# Patient Record
Sex: Male | Born: 1965 | Race: White | Hispanic: No | Marital: Married | Smoking: Never smoker
Health system: Southern US, Community
[De-identification: ages and names within clinical notes are randomized; demographics above are authoritative.]

## PROBLEM LIST (undated history)

## (undated) DIAGNOSIS — F419 Anxiety disorder, unspecified: Secondary | ICD-10-CM

## (undated) DIAGNOSIS — R972 Elevated prostate specific antigen [PSA]: Secondary | ICD-10-CM

## (undated) DIAGNOSIS — K602 Anal fissure, unspecified: Secondary | ICD-10-CM

## (undated) HISTORY — DX: Anxiety disorder, unspecified: F41.9

## (undated) HISTORY — DX: Elevated prostate specific antigen (PSA): R97.20

## (undated) HISTORY — DX: Anal fissure, unspecified: K60.2

## (undated) HISTORY — PX: WISDOM TOOTH EXTRACTION: SHX21

---

## 2002-11-14 ENCOUNTER — Ambulatory Visit (HOSPITAL_COMMUNITY): Admission: RE | Admit: 2002-11-14 | Discharge: 2002-11-14 | Payer: Self-pay | Admitting: Family Medicine

## 2008-07-10 ENCOUNTER — Encounter (INDEPENDENT_AMBULATORY_CARE_PROVIDER_SITE_OTHER): Payer: Self-pay | Admitting: *Deleted

## 2008-07-21 ENCOUNTER — Ambulatory Visit: Payer: Self-pay | Admitting: Family Medicine

## 2008-07-23 ENCOUNTER — Encounter (INDEPENDENT_AMBULATORY_CARE_PROVIDER_SITE_OTHER): Payer: Self-pay | Admitting: *Deleted

## 2009-03-17 ENCOUNTER — Ambulatory Visit: Payer: Self-pay | Admitting: Family Medicine

## 2009-03-18 ENCOUNTER — Telehealth (INDEPENDENT_AMBULATORY_CARE_PROVIDER_SITE_OTHER): Payer: Self-pay | Admitting: *Deleted

## 2009-03-18 LAB — CONVERTED CEMR LAB
Cholesterol: 151 mg/dL (ref 0–200)
HDL: 27.4 mg/dL — ABNORMAL LOW (ref >39.00)
Triglycerides: 100 mg/dL (ref 0.0–149.0)
VLDL: 20 mg/dL (ref 0.0–40.0)

## 2009-11-02 ENCOUNTER — Ambulatory Visit: Payer: Self-pay | Admitting: Family Medicine

## 2009-11-02 DIAGNOSIS — M25561 Pain in right knee: Secondary | ICD-10-CM | POA: Insufficient documentation

## 2009-11-02 DIAGNOSIS — M25569 Pain in unspecified knee: Secondary | ICD-10-CM

## 2009-11-04 ENCOUNTER — Telehealth (INDEPENDENT_AMBULATORY_CARE_PROVIDER_SITE_OTHER): Payer: Self-pay | Admitting: *Deleted

## 2009-11-04 LAB — CONVERTED CEMR LAB
ALT: 19 units/L (ref 0–53)
AST: 23 units/L (ref 0–37)
BUN: 14 mg/dL (ref 6–23)
Basophils Relative: 0.7 % (ref 0.0–3.0)
Chloride: 107 meq/L (ref 96–112)
Eosinophils Relative: 1.5 % (ref 0.0–5.0)
GFR calc non Af Amer: 88.44 mL/min (ref >60)
HCT: 36.2 % — ABNORMAL LOW (ref 39.0–52.0)
Hemoglobin: 11.8 g/dL — ABNORMAL LOW (ref 13.0–17.0)
LDL Cholesterol: 123 mg/dL — ABNORMAL HIGH (ref 0–99)
Lymphs Abs: 1.2 10*3/uL (ref 0.7–4.0)
Monocytes Relative: 9.3 % (ref 3.0–12.0)
Platelets: 302 10*3/uL (ref 150.0–400.0)
Potassium: 4.3 meq/L (ref 3.5–5.1)
RBC: 5.02 M/uL (ref 4.22–5.81)
Sodium: 140 meq/L (ref 135–145)
TSH: 0.93 microintl units/mL (ref 0.35–5.50)
Total Bilirubin: 0.8 mg/dL (ref 0.3–1.2)
Total CHOL/HDL Ratio: 5
Total Protein: 7.3 g/dL (ref 6.0–8.3)
VLDL: 21.8 mg/dL (ref 0.0–40.0)
WBC: 4.5 10*3/uL (ref 4.5–10.5)

## 2009-11-06 ENCOUNTER — Encounter (INDEPENDENT_AMBULATORY_CARE_PROVIDER_SITE_OTHER): Payer: Self-pay | Admitting: *Deleted

## 2009-11-16 ENCOUNTER — Ambulatory Visit: Payer: Self-pay | Admitting: Family Medicine

## 2010-04-21 ENCOUNTER — Telehealth (INDEPENDENT_AMBULATORY_CARE_PROVIDER_SITE_OTHER): Payer: Self-pay | Admitting: *Deleted

## 2010-07-11 LAB — CONVERTED CEMR LAB
Basophils Absolute: 0 10*3/uL (ref 0.0–0.1)
Basophils Relative: 0.1 % (ref 0.0–3.0)
Bilirubin, Direct: 0.1 mg/dL (ref 0.0–0.3)
Calcium: 9 mg/dL (ref 8.4–10.5)
Cholesterol: 177 mg/dL (ref 0–200)
Creatinine, Ser: 0.9 mg/dL (ref 0.4–1.5)
Eosinophils Absolute: 0.1 10*3/uL (ref 0.0–0.7)
GFR calc non Af Amer: 97 mL/min
LDL Cholesterol: 111 mg/dL — ABNORMAL HIGH (ref 0–99)
Lymphocytes Relative: 22.3 % (ref 12.0–46.0)
MCHC: 32.4 g/dL (ref 30.0–36.0)
MCV: 78.5 fL (ref 78.0–100.0)
Neutro Abs: 3.2 10*3/uL (ref 1.4–7.7)
Neutrophils Relative %: 67.7 % (ref 43.0–77.0)
PSA: 1.42 ng/mL (ref 0.10–4.00)
RBC: 5.48 M/uL (ref 4.22–5.81)
RDW: 14.7 % — ABNORMAL HIGH (ref 11.5–14.6)
Sodium: 137 meq/L (ref 135–145)
TSH: 0.9 microintl units/mL (ref 0.35–5.50)
Total Bilirubin: 1.2 mg/dL (ref 0.3–1.2)
Triglycerides: 181 mg/dL — ABNORMAL HIGH (ref 0–149)
VLDL: 36 mg/dL (ref 0–40)

## 2010-07-13 NOTE — Letter (Signed)
Summary: [facility]: Immunoassay Fecal Occult Blood (iFOB) Order Form  [facility] [place]  [address] [place], [place] [address]   Phone: [phone]  Fax: [phone]      [facility]: Immunoassay Fecal Occult Blood (iFOB) Order Form   Nov 06, 2009 MRN: [id number]   COLYN MIRON 04/14/1966   Physicican Name:_________Tabori______________  Diagnosis Code:___________285.9_____________      Doristine Devoid

## 2010-07-13 NOTE — Assessment & Plan Note (Signed)
Summary: cpx/lab//cbs   Vital Signs:  Patient profile:   45 year old male Height:      66 inches Weight:      185 pounds BMI:     29.97 Pulse rate:   62 / minute BP sitting:   112 / 80  (left arm)  Vitals Entered By: Doristine Devoid (Nov 02, 2009 8:33 AM) CC: CPX AND LAB    History of Present Illness: 45 yo man here today for CPE.  has been taking Fish Oil.  R knee pain- 'i can't put weight on it'.  able to ride bike and walk w/out too much difficulty but pain w/ doing steps.  pain is anterior.  occurs after strenuous exercise.  no improvement w/ ice/ibuprofen.  Preventive Screening-Counseling & Management  Alcohol-Tobacco     Alcohol drinks/day: 0     Smoking Status: never  Caffeine-Diet-Exercise     Does Patient Exercise: yes     Type of exercise: bike, walk     Times/week: 4      Sexual History:  currently monogamous.        Drug Use:  never.    Current Medications (verified): 1)  Fish Oil 300 Mg Caps (Omega-3 Fatty Acids) .... As Directed  Allergies (verified): No Known Drug Allergies  Past History:  Family History: Last updated: 07/21/2008 BREAST CA--MOTHER BLADDER CA--FATHER HTN- no DM- Father Hyperlipidemia- no  Social History: Last updated: 07/21/2008 lives w/ wife and 2 kids (47, 47) no tobacco, ETOH, drugs Art gallery manager  Social History: Does Patient Exercise:  yes Sexual History:  currently monogamous Drug Use:  never  Review of Systems  The patient denies anorexia, fever, weight loss, weight gain, vision loss, decreased hearing, hoarseness, chest pain, syncope, dyspnea on exertion, peripheral edema, prolonged cough, headaches, abdominal pain, melena, hematochezia, severe indigestion/heartburn, hematuria, suspicious skin lesions, depression, abnormal bleeding, enlarged lymph nodes, and testicular masses.    Physical Exam  General:  Well-developed,well-nourished,in no acute distress; alert,appropriate and cooperative throughout examination Head:   Normocephalic and atraumatic without obvious abnormalities. No apparent alopecia or balding. Eyes:  No corneal or conjunctival inflammation noted. EOMI. Perrla. Funduscopic exam benign, without hemorrhages, exudates or papilledema. Vision grossly normal. Ears:  External ear exam shows no significant lesions or deformities.  Otoscopic examination reveals clear canals, tympanic membranes are intact bilaterally without bulging, retraction, inflammation or discharge. Hearing is grossly normal bilaterally. Nose:  External nasal examination shows no deformity or inflammation. Nasal mucosa are pink and moist without lesions or exudates. Mouth:  Oral mucosa and oropharynx without lesions or exudates.  Teeth in good repair. Neck:  No deformities, masses, or tenderness noted. Lungs:  Normal respiratory effort, chest expands symmetrically. Lungs are clear to auscultation, no crackles or wheezes. Heart:  Normal rate and regular rhythm. S1 and S2 normal without gallop, murmur, click, rub or other extra sounds. Abdomen:  Bowel sounds positive,abdomen soft and non-tender without masses, organomegaly or hernias noted. Rectal:  No external abnormalities noted. Normal sphincter tone. No rectal masses or tenderness. Genitalia:  Testes bilaterally descended without nodularity, tenderness or masses. No scrotal masses or lesions. No penis lesions or urethral discharge. Prostate:  Prostate gland firm and smooth, no enlargement, nodularity, tenderness, mass, asymmetry or induration. Msk:  R knee- no joint line tenderness, no pain w/ full flexion or extension.  no pain w/ internal/external rotation.  some TTP over patellar tendon Pulses:  +2 carotid, radial, DP Extremities:  No clubbing, cyanosis, edema, or deformity noted with normal full  range of motion of all joints.   Neurologic:  No cranial nerve deficits noted. Station and gait are normal. Plantar reflexes are down-going bilaterally. DTRs are symmetrical throughout.  Sensory, motor and coordinative functions appear intact. Skin:  Intact without suspicious lesions or rashes Cervical Nodes:  No lymphadenopathy noted Axillary Nodes:  No palpable lymphadenopathy Psych:  Cognition and judgment appear intact. Alert and cooperative with normal attention span and concentration. No apparent delusions, illusions, hallucinations   Impression & Recommendations:  Problem # 1:  HEALTHY ADULT MALE (ICD-V70.0) Assessment Unchanged pt's PE WNL.  check labs.  anticipatory guidance provided. Orders: Venipuncture (91478) TLB-Lipid Panel (80061-LIPID) TLB-BMP (Basic Metabolic Panel-BMET) (80048-METABOL) TLB-CBC Platelet - w/Differential (85025-CBCD) TLB-Hepatic/Liver Function Pnl (80076-HEPATIC) TLB-TSH (Thyroid Stimulating Hormone) (84443-TSH) TLB-PSA (Prostate Specific Antigen) (84153-PSA)  Problem # 2:  FAMILY HISTORY OF MALIGNANT NEOPLASM BLADDER (ICD-V16.52) Assessment: New check urine for blood given family hx Orders: TLB-Udip ONLY (81003-UDIP)  Problem # 3:  KNEE PAIN, RIGHT (ICD-719.46) Assessment: New likely a patellar tendonitis given pain w/ stepping up and only after exercise.  start scheduled NSAIDs and ice- if no improvement will refer to sports med.  Pt expresses understanding and is in agreement w/ this plan. His updated medication list for this problem includes:    Naproxen 500 Mg Tabs (Naproxen) .Marland Kitchen... 1 tab by mouth two times a day x7-10 days and then as needed  Complete Medication List: 1)  Fish Oil 300 Mg Caps (Omega-3 fatty acids) .... As directed 2)  Naproxen 500 Mg Tabs (Naproxen) .Marland Kitchen.. 1 tab by mouth two times a day x7-10 days and then as needed  Patient Instructions: 1)  Follow up in 1 year or as needed 2)  Take the Naproxen two times a day x7-10 days (take with food) and then as needed for pain 3)  Ice the knee after exercise 4)  If no improvement in your knee pain after 10-14 days, call and we can send you to sports med 5)  We'll  notify you of your lab results 6)  Call with any question or concerns 7)  Have a great summer!! Prescriptions: NAPROXEN 500 MG TABS (NAPROXEN) 1 tab by mouth two times a day x7-10 days and then as needed  #60 x 1   Entered and Authorized by:   Neena Rhymes MD   Signed by:   Neena Rhymes MD on 11/02/2009   Method used:   Electronically to        [facility]* (retail)       [address]       [place], [place]  [id number]       Ph: [phone]       Fax: [phone]   RxID:   [id number]   Appended Document: cpx/lab//cbs  Laboratory Results   Urine Tests   Date/Time Reported: Nov 02, 2009 11:25 AM   Routine Urinalysis   Color: yellow Appearance: Clear Glucose: negative   (Normal Range: Negative) Bilirubin: negative   (Normal Range: Negative) Ketone: negative   (Normal Range: Negative) Spec. Gravity: >=1.030   (Normal Range: 1.003-1.035) Blood: negative   (Normal Range: Negative) pH: 5.0   (Normal Range: 5.0-8.0) Protein: negative   (Normal Range: Negative) Urobilinogen: negative   (Normal Range: 0-1) Nitrite: negative   (Normal Range: Negative) Leukocyte Esterace: negative   (Normal Range: Negative)    Comments: Floydene Flock  Nov 02, 2009 11:25 AM

## 2010-07-13 NOTE — Progress Notes (Signed)
Summary: labs  Phone Note Outgoing Call   Call placed by: Doristine Devoid,  Nov 04, 2009 3:56 PM Call placed to: Patient Summary of Call: pt w/ iron deficiency anemia- needs to start FeSO4 325mg  daily (also start stool softener) and needs to complete stool cards to evaluate for possible cause of blood loss.  remainder of labs look good.  Follow-up for Phone Call        left message on machine ........Marland KitchenDoristine Devoid  Nov 04, 2009 3:56 PM   spoke w/ patient aware of labs and to start iron medication ...........Marland KitchenDoristine Devoid  Nov 05, 2009 9:03 AM     New/Updated Medications: FERROUS SULFATE 325 (65 FE) MG TABS (FERROUS SULFATE) take one tablet daily Prescriptions: FERROUS SULFATE 325 (65 FE) MG TABS (FERROUS SULFATE) take one tablet daily  #30 x 3   Entered by:   Doristine Devoid   Authorized by:   Neena Rhymes MD   Signed by:   Doristine Devoid on 11/05/2009   Method used:   Electronically to        [facility]* (retail)       [address]       [place], [place]  [id number]       Ph: [phone]       Fax: [phone]   RxID:   [id number]

## 2010-07-13 NOTE — Progress Notes (Signed)
Summary: ferrous refill   Phone Note Refill Request Call back at [phone] Message from:  Pharmacy on April 21, 2010 8:06 AM  Refills Requested: Medication #1:  FERROUS SULFATE 325 (65 FE) MG TABS take one tablet daily.   Dosage confirmed as above?Dosage Confirmed   Supply Requested: 1 month   Last Refilled: 10/16/2009 [facility]  Next Appointment Scheduled: none Initial call taken by: Harold Barban,  April 21, 2010 8:06 AM    Prescriptions: FERROUS SULFATE 325 (65 FE) MG TABS (FERROUS SULFATE) take one tablet daily  #30 x 6   Entered by:   Doristine Devoid CMA   Authorized by:   Neena Rhymes MD   Signed by:   Doristine Devoid CMA on 04/21/2010   Method used:   Electronically to        [facility]* (retail)       [address]       [place], [place]  [id number]       Ph: [phone]       Fax: [phone]   RxID:   [id number]

## 2010-11-18 ENCOUNTER — Encounter: Payer: Self-pay | Admitting: Family Medicine

## 2010-12-14 ENCOUNTER — Encounter: Payer: Self-pay | Admitting: Family Medicine

## 2010-12-16 ENCOUNTER — Encounter: Payer: Self-pay | Admitting: Family Medicine

## 2010-12-16 ENCOUNTER — Ambulatory Visit (INDEPENDENT_AMBULATORY_CARE_PROVIDER_SITE_OTHER): Payer: BC Managed Care – PPO | Admitting: Family Medicine

## 2010-12-16 DIAGNOSIS — Z0000 Encounter for general adult medical examination without abnormal findings: Secondary | ICD-10-CM

## 2010-12-16 LAB — CBC WITH DIFFERENTIAL/PLATELET
Basophils Absolute: 0 10*3/uL (ref 0.0–0.1)
Eosinophils Absolute: 0.1 10*3/uL (ref 0.0–0.7)
Eosinophils Relative: 2.4 % (ref 0.0–5.0)
HCT: 43.8 % (ref 39.0–52.0)
Lymphs Abs: 1 10*3/uL (ref 0.7–4.0)
MCHC: 33.9 g/dL (ref 30.0–36.0)
MCV: 86.7 fl (ref 78.0–100.0)
Monocytes Absolute: 0.4 10*3/uL (ref 0.1–1.0)
Neutrophils Relative %: 66.4 % (ref 43.0–77.0)
Platelets: 217 10*3/uL (ref 150.0–400.0)
RDW: 14.6 % (ref 11.5–14.6)
WBC: 4.5 10*3/uL (ref 4.5–10.5)

## 2010-12-16 LAB — POCT URINALYSIS DIPSTICK
Bilirubin, UA: NEGATIVE
Blood, UA: NEGATIVE
Ketones, UA: NEGATIVE
Leukocytes, UA: NEGATIVE
Nitrite, UA: NEGATIVE
pH, UA: 5

## 2010-12-16 LAB — HEPATIC FUNCTION PANEL
AST: 19 U/L (ref 0–37)
Albumin: 4.2 g/dL (ref 3.5–5.2)
Alkaline Phosphatase: 107 U/L (ref 39–117)
Bilirubin, Direct: 0 mg/dL (ref 0.0–0.3)
Total Bilirubin: 0.5 mg/dL (ref 0.3–1.2)

## 2010-12-16 LAB — LIPID PANEL
HDL: 37.6 mg/dL — ABNORMAL LOW (ref >39.00)
Triglycerides: 230 mg/dL — ABNORMAL HIGH (ref 0.0–149.0)
VLDL: 46 mg/dL — ABNORMAL HIGH (ref 0.0–40.0)

## 2010-12-16 LAB — BASIC METABOLIC PANEL
Calcium: 8.7 mg/dL (ref 8.4–10.5)
GFR: 93.55 mL/min (ref >60.00)
Potassium: 4.1 mEq/L (ref 3.5–5.1)
Sodium: 136 mEq/L (ref 135–145)

## 2010-12-16 LAB — PSA: PSA: 0.75 ng/mL (ref 0.10–4.00)

## 2010-12-16 NOTE — Assessment & Plan Note (Signed)
Pt's PE WNL.  Check labs.  Anticipatory guidance provided.  

## 2010-12-16 NOTE — Progress Notes (Signed)
  Subjective:    Patient ID: Alexander Schroeder, male    DOB: 10/12/1965, 45 y.o.   MRN: [id number]  HPI CPE- no concerns about health.   Review of Systems Patient reports no vision/hearing changes, anorexia, fever ,adenopathy, persistant/recurrent hoarseness, swallowing issues, chest pain, palpitations, edema, persistant/recurrent cough, hemoptysis, dyspnea (rest,exertional, paroxysmal nocturnal), gastrointestinal  bleeding (melena, rectal bleeding), abdominal pain, excessive heart burn, GU symptoms (dysuria, hematuria, voiding/incontinence issues) syncope, focal weakness, memory loss, numbness & tingling, skin/hair/nail changes, depression, anxiety, abnormal bruising/bleeding, musculoskeletal symptoms/signs.     Objective:   Physical Exam BP 120/80  Temp(Src) 98.9 F (37.2 C) (Oral)  Ht 5\' 6"  (1.676 m)  Wt 185 lb 3.2 oz (84.006 kg)  BMI 29.89 kg/m2  General Appearance:    Alert, cooperative, no distress, appears stated age  Head:    Normocephalic, without obvious abnormality, atraumatic  Eyes:    PERRL, conjunctiva/corneas clear, EOM's intact, fundi    benign, both eyes       Ears:    Normal TM's and external ear canals, both ears  Nose:   Nares normal, septum midline, mucosa normal, no drainage   or sinus tenderness  Throat:   Lips, mucosa, and tongue normal; teeth and gums normal  Neck:   Supple, symmetrical, trachea midline, no adenopathy;       thyroid:  No enlargement/tenderness/nodules  Back:     Symmetric, no curvature, ROM normal, no CVA tenderness  Lungs:     Clear to auscultation bilaterally, respirations unlabored  Chest wall:    No tenderness or deformity  Heart:    Regular rate and rhythm, S1 and S2 normal, no murmur, rub   or gallop  Abdomen:     Soft, non-tender, bowel sounds active all four quadrants,    no masses, no organomegaly  Genitalia:    Normal male without lesion, masses, discharge or tenderness  Rectal:    Normal tone, normal prostate, no masses or  tenderness;   guaiac negative stool  Extremities:   Extremities normal, atraumatic, no cyanosis or edema  Pulses:   2+ and symmetric all extremities  Skin:   Skin color, texture, turgor normal, no rashes or lesions  Lymph nodes:   Cervical, supraclavicular, and axillary nodes normal  Neurologic:   CNII-XII intact. Normal strength, sensation and reflexes      throughout          Assessment & Plan:

## 2010-12-16 NOTE — Patient Instructions (Signed)
We'll notify you of your lab results Your exam looks great!  Keep up the good work! We'll see you in 1 year- sooner if needed Have a great trip!

## 2010-12-17 ENCOUNTER — Encounter: Payer: Self-pay | Admitting: *Deleted

## 2010-12-26 ENCOUNTER — Other Ambulatory Visit: Payer: Self-pay | Admitting: Family Medicine

## 2010-12-27 NOTE — Telephone Encounter (Signed)
Refill sent.

## 2011-05-26 ENCOUNTER — Other Ambulatory Visit: Payer: Self-pay | Admitting: Family Medicine

## 2011-05-26 MED ORDER — FERROUS SULFATE 325 (65 FE) MG PO TABS: 325.0000 mg | ORAL_TABLET | Freq: Every day | ORAL | Status: DC

## 2011-05-26 NOTE — Telephone Encounter (Signed)
rx sent to pharmacy by e-script  

## 2011-10-10 ENCOUNTER — Telehealth: Payer: Self-pay | Admitting: Family Medicine

## 2011-10-10 NOTE — Telephone Encounter (Signed)
Refill: Ferrous sulfate 325 mg #30. Take 1 tablet once daily. Last fill 09-08-11

## 2011-10-13 MED ORDER — FERROUS SULFATE 325 (65 FE) MG PO TABS: 325.0000 mg | ORAL_TABLET | Freq: Every day | ORAL | Status: DC

## 2011-10-13 NOTE — Telephone Encounter (Signed)
rx sent to pharmacy by e-script  

## 2012-01-11 ENCOUNTER — Encounter: Payer: Self-pay | Admitting: Family Medicine

## 2012-01-11 ENCOUNTER — Ambulatory Visit (INDEPENDENT_AMBULATORY_CARE_PROVIDER_SITE_OTHER): Payer: BC Managed Care – PPO | Admitting: Family Medicine

## 2012-01-11 VITALS — BP 125/72 | HR 68 | Temp 98.0°F | Ht 66.0 in | Wt 185.0 lb

## 2012-01-11 DIAGNOSIS — Z0000 Encounter for general adult medical examination without abnormal findings: Secondary | ICD-10-CM

## 2012-01-11 LAB — CBC WITH DIFFERENTIAL/PLATELET
Basophils Relative: 0.7 % (ref 0.0–3.0)
Eosinophils Relative: 1.7 % (ref 0.0–5.0)
Hemoglobin: 15.2 g/dL (ref 13.0–17.0)
Lymphocytes Relative: 24.8 % (ref 12.0–46.0)
MCHC: 33.4 g/dL (ref 30.0–36.0)
Neutro Abs: 2.8 10*3/uL (ref 1.4–7.7)
Neutrophils Relative %: 64.4 % (ref 43.0–77.0)
RBC: 5.23 Mil/uL (ref 4.22–5.81)
WBC: 4.3 10*3/uL — ABNORMAL LOW (ref 4.5–10.5)

## 2012-01-11 LAB — HEPATIC FUNCTION PANEL
ALT: 20 U/L (ref 0–53)
AST: 18 U/L (ref 0–37)
Albumin: 3.9 g/dL (ref 3.5–5.2)
Total Protein: 6.9 g/dL (ref 6.0–8.3)

## 2012-01-11 LAB — PSA: PSA: 2.08 ng/mL (ref 0.10–4.00)

## 2012-01-11 LAB — BASIC METABOLIC PANEL
BUN: 11 mg/dL (ref 6–23)
CO2: 28 mEq/L (ref 19–32)
Chloride: 104 mEq/L (ref 96–112)
Creatinine, Ser: 1 mg/dL (ref 0.4–1.5)
Glucose, Bld: 104 mg/dL — ABNORMAL HIGH (ref 70–99)
Potassium: 4.3 mEq/L (ref 3.5–5.1)

## 2012-01-11 LAB — LIPID PANEL
Cholesterol: 194 mg/dL (ref 0–200)
VLDL: 18.2 mg/dL (ref 0.0–40.0)

## 2012-01-11 LAB — TSH: TSH: 0.93 u[IU]/mL (ref 0.35–5.50)

## 2012-01-11 MED ORDER — TETANUS-DIPHTH-ACELL PERTUSSIS 5-2.5-18.5 LF-MCG/0.5 IM SUSP
0.5000 mL | Freq: Once | INTRAMUSCULAR | Status: AC
  Administered 2012-01-11: 0.5 mL via INTRAMUSCULAR

## 2012-01-11 NOTE — Patient Instructions (Addendum)
Follow up in 1 year or as needed We'll notify you of your lab results Keep up the good work!  You look great! Call with any questions or concerns Enjoy the rest of your summer!!!

## 2012-01-11 NOTE — Assessment & Plan Note (Signed)
Normal PE.  Check labs.  Anticipatory guidance provided.  

## 2012-01-11 NOTE — Progress Notes (Signed)
  Subjective:    Patient ID: Alexander Schroeder, male    DOB: [birth year], 46 y.o.   MRN: [id number]  HPI CPE- no concerns.   Review of Systems Patient reports no vision/hearing changes, anorexia, fever ,adenopathy, persistant/recurrent hoarseness, swallowing issues, chest pain, palpitations, edema, persistant/recurrent cough, hemoptysis, dyspnea (rest,exertional, paroxysmal nocturnal), gastrointestinal  bleeding (melena, rectal bleeding), abdominal pain, excessive heart burn, GU symptoms (dysuria, hematuria, voiding/incontinence issues) syncope, focal weakness, memory loss, numbness & tingling, skin/hair/nail changes, depression, anxiety, abnormal bruising/bleeding, musculoskeletal symptoms/signs.     Objective:   Physical Exam BP 125/72  Pulse 68  Temp 98 F (36.7 C) (Oral)  Ht 5\' 6"  (1.676 m)  Wt 185 lb (83.915 kg)  BMI 29.86 kg/m2  SpO2 98%  General Appearance:    Alert, cooperative, no distress, appears stated age  Head:    Normocephalic, without obvious abnormality, atraumatic  Eyes:    PERRL, conjunctiva/corneas clear, EOM's intact, fundi    benign, both eyes       Ears:    Normal TM's and external ear canals, both ears  Nose:   Nares normal, septum midline, mucosa normal, no drainage   or sinus tenderness  Throat:   Lips, mucosa, and tongue normal; teeth and gums normal  Neck:   Supple, symmetrical, trachea midline, no adenopathy;       thyroid:  No enlargement/tenderness/nodules  Back:     Symmetric, no curvature, ROM normal, no CVA tenderness  Lungs:     Clear to auscultation bilaterally, respirations unlabored  Chest wall:    No tenderness or deformity  Heart:    Regular rate and rhythm, S1 and S2 normal, no murmur, rub   or gallop  Abdomen:     Soft, non-tender, bowel sounds active all four quadrants,    no masses, no organomegaly  Genitalia:    Normal male without lesion, discharge or tenderness  Rectal:    Normal tone, normal prostate, no masses or tenderness    Extremities:   Extremities normal, atraumatic, no cyanosis or edema  Pulses:   2+ and symmetric all extremities  Skin:   Skin color, texture, turgor normal, no rashes or lesions  Lymph nodes:   Cervical, supraclavicular, and axillary nodes normal  Neurologic:   CNII-XII intact. Normal strength, sensation and reflexes      throughout          Assessment & Plan:

## 2012-01-17 ENCOUNTER — Other Ambulatory Visit: Payer: Self-pay | Admitting: *Deleted

## 2012-01-17 DIAGNOSIS — R972 Elevated prostate specific antigen [PSA]: Secondary | ICD-10-CM

## 2012-02-14 ENCOUNTER — Other Ambulatory Visit: Payer: Self-pay | Admitting: Family Medicine

## 2012-02-14 MED ORDER — FERROUS SULFATE 325 (65 FE) MG PO TABS: 325.0000 mg | ORAL_TABLET | Freq: Every day | ORAL | Status: DC

## 2012-02-14 NOTE — Telephone Encounter (Signed)
rx sent to pharmacy by e-script  

## 2012-02-14 NOTE — Telephone Encounter (Signed)
Refill ferrous sulfate 325 MG Take 1 tablet once daily # 30, last fill 7.31.13 Last ov 7.31.13 ferrous sulfate 325 (65 FE) MG Take 1 tablet (325 mg total) by mouth daily with breakfast. 70

## 2012-05-25 ENCOUNTER — Encounter: Payer: Self-pay | Admitting: Family Medicine

## 2012-05-25 ENCOUNTER — Ambulatory Visit (INDEPENDENT_AMBULATORY_CARE_PROVIDER_SITE_OTHER): Payer: BC Managed Care – PPO | Admitting: Family Medicine

## 2012-05-25 VITALS — BP 130/90 | HR 65 | Temp 98.2°F | Ht 66.0 in | Wt 181.6 lb

## 2012-05-25 DIAGNOSIS — G4700 Insomnia, unspecified: Secondary | ICD-10-CM

## 2012-05-25 DIAGNOSIS — F411 Generalized anxiety disorder: Secondary | ICD-10-CM

## 2012-05-25 DIAGNOSIS — F419 Anxiety disorder, unspecified: Secondary | ICD-10-CM

## 2012-05-25 MED ORDER — BUPROPION HCL ER (XL) 150 MG PO TB24: 150.0000 mg | ORAL_TABLET | Freq: Every day | ORAL | Status: DC

## 2012-05-25 MED ORDER — TRAZODONE HCL 50 MG PO TABS: 25.0000 mg | ORAL_TABLET | Freq: Every evening | ORAL | Status: DC | PRN

## 2012-05-25 NOTE — Progress Notes (Signed)
  Subjective:    Patient ID: Alexander Schroeder, male    DOB: [birth year], 46 y.o.   MRN: [id number]  HPI Insomnia- 'i can't seem to relax'.  'can't lay flat in the bed- my head's pounding, it feels like i can't breathe'.  Is now stressing about 'it getting dark, going to bed, getting up'.  Work is going well.  Kids are well.  sxs started 4 weeks ago when he had cold sxs.  Was taking Mucinex D.  Started worrying that if he fell asleep he wouldn't breathe.  Then had travel interfere w/ his sleep.  In the last 2 weeks has started waking at the same time every night and not being able to fall back to sleep.  Sleeping better in recliner than bed.  Denies GERD, no coughing, no loud snoring, no breathing pauses.  Will wake w/ thoughts racing.  Has not tried OTC sleep aides.  Pt and wife admit to underlying anxiety.  Both are tearful during visit.   Review of Systems For ROS see HPI     Objective:   Physical Exam  Vitals reviewed. Constitutional: He is oriented to person, place, and time. He appears well-developed and well-nourished.       tearful  HENT:  Head: Normocephalic and atraumatic.  Neurological: He is alert and oriented to person, place, and time.  Skin: Skin is warm and dry.  Psychiatric: His behavior is normal. Thought content normal.       Tearful, anxious          Assessment & Plan:

## 2012-05-25 NOTE — Patient Instructions (Addendum)
Follow up in 3-4 weeks to recheck sleep/mood Start the Wellbutrin daily Take the Trazodone- start w/ 1/2 tab- for sleep Try and find a stress outlet Call with any questions or concerns Happy Holidays!!!

## 2012-05-27 NOTE — Assessment & Plan Note (Signed)
New.  Likely a manifestation of pt's anxiety.  Start daily anxiety med- wellbutrin- and tx insomnia w/ trazodone.  Reviewed supportive care and red flags that should prompt return.  Pt expressed understanding and is in agreement w/ plan.

## 2012-05-27 NOTE — Assessment & Plan Note (Signed)
New.  Both pt and wife are tearful during OV.  Pt admits to underlying anxiety for 'years' but recently worsening.  Will start daily controller med and assist sleep w/ trazodone.  Discussed counseling.  Names and #s provided.  Will follow closely.  Pt and wife in agreement w/ plan.

## 2012-06-01 ENCOUNTER — Ambulatory Visit (INDEPENDENT_AMBULATORY_CARE_PROVIDER_SITE_OTHER): Payer: BC Managed Care – PPO | Admitting: Family Medicine

## 2012-06-01 ENCOUNTER — Telehealth: Payer: Self-pay | Admitting: Family Medicine

## 2012-06-01 ENCOUNTER — Encounter: Payer: Self-pay | Admitting: Family Medicine

## 2012-06-01 VITALS — BP 112/76 | HR 76 | Temp 98.3°F | Wt 183.0 lb

## 2012-06-01 DIAGNOSIS — F411 Generalized anxiety disorder: Secondary | ICD-10-CM

## 2012-06-01 DIAGNOSIS — F410 Panic disorder [episodic paroxysmal anxiety] without agoraphobia: Secondary | ICD-10-CM

## 2012-06-01 DIAGNOSIS — F419 Anxiety disorder, unspecified: Secondary | ICD-10-CM

## 2012-06-01 MED ORDER — CITALOPRAM HYDROBROMIDE 20 MG PO TABS: 20.0000 mg | ORAL_TABLET | Freq: Every day | ORAL | Status: DC

## 2012-06-01 MED ORDER — ALPRAZOLAM 0.25 MG PO TABS: ORAL_TABLET | ORAL | Status: DC

## 2012-06-01 NOTE — Progress Notes (Signed)
  Subjective:    Patient ID: Alexander Schroeder, male    DOB: May 24, 1966, 46 y.o.   MRN: [id number]  HPI Pt here to f/u anxiety.  He feels like the wellbutrin is making him obsess over his breathing and he is having inc anxiety.   Review of Systems As above    Objective:   Physical Exam BP 112/76  Pulse 76  Temp 98.3 F (36.8 C) (Oral)  Wt 183 lb (83.008 kg)  SpO2 98% General appearance: alert, cooperative, appears stated age and no distress Psych--  Anxiety, fidgety       Assessment & Plan:

## 2012-06-01 NOTE — Telephone Encounter (Signed)
Appointment Scheduled:  06/01/2012 15:00:00  Appointment Scheduled Provider:  Lelon Perla

## 2012-06-01 NOTE — Patient Instructions (Addendum)
Anxiety and Panic Attacks Your caregiver has informed you that you are having an anxiety or panic attack. There may be many forms of this. Most of the time these attacks come suddenly and without warning. They come at any time of day, including periods of sleep, and at any time of life. They may be strong and unexplained. Although panic attacks are very scary, they are physically harmless. Sometimes the cause of your anxiety is not known. Anxiety is a protective mechanism of the body in its fight or flight mechanism. Most of these perceived danger situations are actually nonphysical situations (such as anxiety over losing a job). CAUSES  The causes of an anxiety or panic attack are many. Panic attacks may occur in otherwise healthy people given a certain set of circumstances. There may be a genetic cause for panic attacks. Some medications may also have anxiety as a side effect. SYMPTOMS  Some of the most common feelings are:  Intense terror.  Dizziness, feeling faint.  Hot and cold flashes.  Fear of going crazy.  Feelings that nothing is real.  Sweating.  Shaking.  Chest pain or a fast heartbeat (palpitations).  Smothering, choking sensations.  Feelings of impending doom and that death is near.  Tingling of extremities, this may be from over-breathing.  Altered reality (derealization).  Being detached from yourself (depersonalization). Several symptoms can be present to make up anxiety or panic attacks. DIAGNOSIS  The evaluation by your caregiver will depend on the type of symptoms you are experiencing. The diagnosis of anxiety or panic attack is made when no physical illness can be determined to be a cause of the symptoms. TREATMENT  Treatment to prevent anxiety and panic attacks may include:  Avoidance of circumstances that cause anxiety.  Reassurance and relaxation.  Regular exercise.  Relaxation therapies, such as yoga.  Psychotherapy with a psychiatrist or  therapist.  Avoidance of caffeine, alcohol and illegal drugs.  Prescribed medication. SEEK IMMEDIATE MEDICAL CARE IF:   You experience panic attack symptoms that are different than your usual symptoms.  You have any worsening or concerning symptoms. Document Released: 05/30/2005 Document Revised: 08/22/2011 Document Reviewed: 10/01/2009 [facility] Patient Information 2013 [place], [place].   Start with 1/2 tab of celexa  for 7-8 days then increase to 1 tab daily

## 2012-06-01 NOTE — Telephone Encounter (Signed)
Patient Information:  Caller Name: Rashod  Phone: [phone]  Patient: Alexander Schroeder, Alexander Schroeder  Gender: Male  DOB: 04-10-1966  Age: 46 Years  PCP: Sheliah Hatch.  Office Follow Up:  Does the office need to follow up with this patient?: No  Instructions For The Office: N/A  RN Note:  No cough or nasal congestion. Respiratory rate 16 rpm. No palpitations. Denies chest pain or sore throat. Instructed not to make any changes to prescriptions until advised by MD.  Algis Downs to see MD within 24 hours for new or increasing symptoms and taking medications/following treatment as directed per Anxiety guideline.  Symptoms  Reason For Call & Symptoms: Called regarding anxiety medication.  Concerned is "obsessing" about breathing unless busy.  Asking if Wellbutrin may be making this worse?  Obsessing happens during the day.  Sleeping better since started Trazadone.  Reviewed Health History In EMR: Yes  Reviewed Medications In EMR: Yes  Reviewed Allergies In EMR: Yes  Reviewed Surgeries / Procedures: Yes  Date of Onset of Symptoms: 05/25/2012  Guideline(s) Used:  No Protocol Available - Information Only  Disposition Per Guideline:   Discuss with PCP and Callback by Nurse within 1 Hour  Reason For Disposition Reached:   Nursing judgment  Advice Given:  N/A  RN Overrode Recommendation:  Make Appointment  needs to see MD within 24 hours  Appointment Scheduled:  06/01/2012 15:00:00 Appointment Scheduled Provider:  Lelon Perla.

## 2012-06-02 NOTE — Assessment & Plan Note (Signed)
D/c wellbutrin Start celexa and xanax F/u next week with Dr Beverely Low

## 2012-06-07 ENCOUNTER — Encounter: Payer: Self-pay | Admitting: Lab

## 2012-06-08 ENCOUNTER — Encounter: Payer: Self-pay | Admitting: Family Medicine

## 2012-06-08 ENCOUNTER — Ambulatory Visit (INDEPENDENT_AMBULATORY_CARE_PROVIDER_SITE_OTHER): Payer: BC Managed Care – PPO | Admitting: Family Medicine

## 2012-06-08 VITALS — BP 128/80 | HR 56 | Temp 98.0°F | Ht 66.0 in | Wt 184.8 lb

## 2012-06-08 DIAGNOSIS — F411 Generalized anxiety disorder: Secondary | ICD-10-CM

## 2012-06-08 DIAGNOSIS — G4700 Insomnia, unspecified: Secondary | ICD-10-CM

## 2012-06-08 DIAGNOSIS — F419 Anxiety disorder, unspecified: Secondary | ICD-10-CM

## 2012-06-08 NOTE — Patient Instructions (Addendum)
Follow up in 6-8 weeks to recheck anxiety and sleep Increase to 1 tab of the celexa (citalopram) daily If you have any difficulty w/ the increased dose- drop back down to 1/2 and call me. Call with any questions or concerns Happy New Year and Happy early birthday!!!

## 2012-06-08 NOTE — Progress Notes (Signed)
  Subjective:    Patient ID: Alexander Schroeder, male    DOB: 1965-09-14, 46 y.o.   MRN: [id number]  HPI Anxiety- pt did not tolerate Wellbutrin, this made anxiety worse.  Was switched to Celexa at last OV.  sxs are better.  Taking 1/2 tab (10mg ) of SSRI.  Has used 3 xanax x3 for rescue med.  Pt reports insomnia is much improved w/ Trazodone.     Review of Systems For ROS see HPI     Objective:   Physical Exam  Vitals reviewed. Constitutional: He is oriented to person, place, and time. He appears well-developed and well-nourished. No distress.  Neurological: He is alert and oriented to person, place, and time.  Psychiatric: He has a normal mood and affect. His behavior is normal. Judgment and thought content normal.          Assessment & Plan:

## 2012-06-08 NOTE — Assessment & Plan Note (Signed)
Continue trazodone prn.  Sleep has improved.

## 2012-06-08 NOTE — Assessment & Plan Note (Signed)
Improved since starting low dose SSRI.  Increase to 20mg  as pt has tolerated 10 w/out difficulty.  Reviewed supportive care and red flags that should prompt return.  Pt expressed understanding and is in agreement w/ plan.

## 2012-06-21 ENCOUNTER — Ambulatory Visit: Payer: BC Managed Care – PPO | Admitting: Family Medicine

## 2012-07-20 ENCOUNTER — Encounter: Payer: Self-pay | Admitting: Lab

## 2012-07-23 ENCOUNTER — Ambulatory Visit (INDEPENDENT_AMBULATORY_CARE_PROVIDER_SITE_OTHER): Payer: BC Managed Care – PPO | Admitting: Family Medicine

## 2012-07-23 ENCOUNTER — Encounter: Payer: Self-pay | Admitting: Family Medicine

## 2012-07-23 VITALS — BP 110/80 | HR 56 | Temp 98.1°F | Ht 66.25 in | Wt 185.8 lb

## 2012-07-23 DIAGNOSIS — F419 Anxiety disorder, unspecified: Secondary | ICD-10-CM

## 2012-07-23 DIAGNOSIS — F411 Generalized anxiety disorder: Secondary | ICD-10-CM

## 2012-07-23 NOTE — Patient Instructions (Addendum)
Schedule your complete physical for August Keep up the good work!  You look great! Call with any questions or concerns Happy Belated Birthday!!!

## 2012-07-23 NOTE — Progress Notes (Signed)
  Subjective:    Patient ID: Alexander Schroeder, male    DOB: 1965-09-04, 47 y.o.   MRN: [id number]  HPI Anxiety- 'feeling real good'.  Wife has noted + change.  Feeling much more able to deal w/ stressors.  Sleep has dramatically improved and no longer needing sleeping pills.  Eating well, exercising regularly.  No need to increase meds at this time.   Review of Systems For ROS see HPI     Objective:   Physical Exam  Vitals reviewed. Constitutional: He appears well-developed and well-nourished. No distress.  Psychiatric: He has a normal mood and affect. His behavior is normal. Judgment and thought content normal.          Assessment & Plan:

## 2012-07-23 NOTE — Assessment & Plan Note (Signed)
Improved.  Both pt's anxiety and sleep have improved w/ increased dose of Celexa.  Will continue 20mg  daily and follow at upcoming appt.

## 2012-07-28 ENCOUNTER — Other Ambulatory Visit: Payer: Self-pay

## 2012-09-19 ENCOUNTER — Telehealth: Payer: Self-pay | Admitting: Family Medicine

## 2012-09-19 NOTE — Telephone Encounter (Signed)
Refill: Ferrous sulfate 325 mg. Take 1 tablet once daily. Qty 30. Last fill 08-20-12

## 2012-09-20 MED ORDER — FERROUS SULFATE 325 (65 FE) MG PO TABS: 325.0000 mg | ORAL_TABLET | Freq: Every day | ORAL | Status: DC

## 2012-09-20 NOTE — Telephone Encounter (Signed)
Rx sent to the pharmacy by e-script.//AB/CMA 

## 2012-10-01 ENCOUNTER — Other Ambulatory Visit: Payer: Self-pay | Admitting: Family Medicine

## 2013-01-14 ENCOUNTER — Encounter: Payer: BC Managed Care – PPO | Admitting: Family Medicine

## 2013-02-06 ENCOUNTER — Ambulatory Visit (INDEPENDENT_AMBULATORY_CARE_PROVIDER_SITE_OTHER): Payer: BC Managed Care – PPO | Admitting: Family Medicine

## 2013-02-06 ENCOUNTER — Encounter: Payer: Self-pay | Admitting: Family Medicine

## 2013-02-06 VITALS — BP 110/80 | HR 69 | Temp 98.0°F | Ht 66.25 in | Wt 190.6 lb

## 2013-02-06 DIAGNOSIS — Z1331 Encounter for screening for depression: Secondary | ICD-10-CM

## 2013-02-06 DIAGNOSIS — Z0000 Encounter for general adult medical examination without abnormal findings: Secondary | ICD-10-CM

## 2013-02-06 NOTE — Progress Notes (Signed)
  Subjective:    Patient ID: Alexander Schroeder, male    DOB: 03/25/1966, 47 y.o.   MRN: [id number]  HPI CPE- no concerns today.  Doing well.   Review of Systems Patient reports no vision/hearing changes, anorexia, fever ,adenopathy, persistant/recurrent hoarseness, swallowing issues, chest pain, palpitations, edema, persistant/recurrent cough, hemoptysis, dyspnea (rest,exertional, paroxysmal nocturnal), gastrointestinal  bleeding (melena, rectal bleeding), abdominal pain, excessive heart burn, GU symptoms (dysuria, hematuria, voiding/incontinence issues) syncope, focal weakness, memory loss, numbness & tingling, skin/hair/nail changes, depression, anxiety, abnormal bruising/bleeding, musculoskeletal symptoms/signs.     Objective:   Physical Exam BP 110/80  Pulse 69  Temp(Src) 98 F (36.7 C) (Oral)  Ht 5' 6.25" (1.683 m)  Wt 190 lb 9.6 oz (86.456 kg)  BMI 30.52 kg/m2  SpO2 96%  General Appearance:    Alert, cooperative, no distress, appears stated age  Head:    Normocephalic, without obvious abnormality, atraumatic  Eyes:    PERRL, conjunctiva/corneas clear, EOM's intact, fundi    benign, both eyes       Ears:    Normal TM's and external ear canals, both ears  Nose:   Nares normal, septum midline, mucosa normal, no drainage   or sinus tenderness  Throat:   Lips, mucosa, and tongue normal; teeth and gums normal  Neck:   Supple, symmetrical, trachea midline, no adenopathy;       thyroid:  No enlargement/tenderness/nodules  Back:     Symmetric, no curvature, ROM normal, no CVA tenderness  Lungs:     Clear to auscultation bilaterally, respirations unlabored  Chest wall:    No tenderness or deformity  Heart:    Regular rate and rhythm, S1 and S2 normal, no murmur, rub   or gallop  Abdomen:     Soft, non-tender, bowel sounds active all four quadrants,    no masses, no organomegaly  Genitalia:    Normal male without lesion, discharge or tenderness  Rectal:    Normal tone, normal  prostate, no masses or tenderness  Extremities:   Extremities normal, atraumatic, no cyanosis or edema  Pulses:   2+ and symmetric all extremities  Skin:   Skin color, texture, turgor normal, no rashes or lesions  Lymph nodes:   Cervical, supraclavicular, and axillary nodes normal  Neurologic:   CNII-XII intact. Normal strength, sensation and reflexes      throughout          Assessment & Plan:

## 2013-02-06 NOTE — Patient Instructions (Addendum)
Follow up in 1 year or as needed Keep up the good work!  You look great! We'll notify you of your lab results and make any changes if needed Call with any questions or concerns Happy Labor Day! 

## 2013-02-06 NOTE — Assessment & Plan Note (Signed)
Pt's PE WNL.  Due for colonoscopy next year.  Check labs.  Anticipatory guidance provided.  

## 2013-02-07 LAB — HEPATIC FUNCTION PANEL
ALT: 23 U/L (ref 0–53)
Total Bilirubin: 1.1 mg/dL (ref 0.3–1.2)

## 2013-02-07 LAB — TSH: TSH: 1.42 u[IU]/mL (ref 0.35–5.50)

## 2013-02-07 LAB — CBC WITH DIFFERENTIAL/PLATELET
Basophils Absolute: 0.1 10*3/uL (ref 0.0–0.1)
Basophils Relative: 1.3 % (ref 0.0–3.0)
Eosinophils Absolute: 0.1 10*3/uL (ref 0.0–0.7)
Hemoglobin: 15.1 g/dL (ref 13.0–17.0)
Lymphs Abs: 1.2 10*3/uL (ref 0.7–4.0)
MCHC: 33.5 g/dL (ref 30.0–36.0)
MCV: 84.9 fl (ref 78.0–100.0)
Monocytes Absolute: 0.3 10*3/uL (ref 0.1–1.0)
Neutro Abs: 4 10*3/uL (ref 1.4–7.7)
RBC: 5.31 Mil/uL (ref 4.22–5.81)
RDW: 13.9 % (ref 11.5–14.6)

## 2013-02-07 LAB — BASIC METABOLIC PANEL
BUN: 11 mg/dL (ref 6–23)
Calcium: 9.2 mg/dL (ref 8.4–10.5)
Creatinine, Ser: 1 mg/dL (ref 0.4–1.5)
GFR: 84.21 mL/min (ref >60.00)

## 2013-02-07 LAB — LIPID PANEL
Cholesterol: 205 mg/dL — ABNORMAL HIGH (ref 0–200)
HDL: 36.4 mg/dL — ABNORMAL LOW (ref >39.00)
Triglycerides: 127 mg/dL (ref 0.0–149.0)
VLDL: 25.4 mg/dL (ref 0.0–40.0)

## 2013-02-08 LAB — LDL CHOLESTEROL, DIRECT: Direct LDL: 150.2 mg/dL

## 2013-03-23 ENCOUNTER — Other Ambulatory Visit: Payer: Self-pay | Admitting: Family Medicine

## 2013-03-25 NOTE — Telephone Encounter (Signed)
Med filled.  

## 2013-04-01 ENCOUNTER — Other Ambulatory Visit: Payer: Self-pay | Admitting: General Practice

## 2013-04-01 MED ORDER — CITALOPRAM HYDROBROMIDE 20 MG PO TABS: ORAL_TABLET | ORAL | Status: DC

## 2013-04-01 NOTE — Telephone Encounter (Signed)
Med filled.  

## 2013-04-18 ENCOUNTER — Other Ambulatory Visit: Payer: Self-pay

## 2013-06-28 ENCOUNTER — Other Ambulatory Visit: Payer: Self-pay | Admitting: Family Medicine

## 2013-06-28 NOTE — Telephone Encounter (Signed)
Med filled.  

## 2013-08-21 ENCOUNTER — Other Ambulatory Visit: Payer: Self-pay | Admitting: Family Medicine

## 2013-08-21 NOTE — Telephone Encounter (Signed)
Med filled.  

## 2013-08-28 ENCOUNTER — Other Ambulatory Visit: Payer: Self-pay | Admitting: Family Medicine

## 2013-08-28 NOTE — Telephone Encounter (Signed)
Med filled.  

## 2013-12-26 ENCOUNTER — Other Ambulatory Visit: Payer: Self-pay | Admitting: Family Medicine

## 2013-12-26 NOTE — Telephone Encounter (Signed)
Med filled, pt has a CPE in august.

## 2014-02-10 ENCOUNTER — Ambulatory Visit (INDEPENDENT_AMBULATORY_CARE_PROVIDER_SITE_OTHER): Payer: BC Managed Care – PPO | Admitting: Family Medicine

## 2014-02-10 ENCOUNTER — Encounter: Payer: Self-pay | Admitting: Family Medicine

## 2014-02-10 VITALS — BP 132/84 | HR 58 | Temp 98.0°F | Resp 16 | Ht 66.25 in | Wt 197.4 lb

## 2014-02-10 DIAGNOSIS — Z0000 Encounter for general adult medical examination without abnormal findings: Secondary | ICD-10-CM

## 2014-02-10 DIAGNOSIS — Z1211 Encounter for screening for malignant neoplasm of colon: Secondary | ICD-10-CM

## 2014-02-10 LAB — PSA: PSA: 0.67 ng/mL (ref 0.10–4.00)

## 2014-02-10 LAB — HEPATIC FUNCTION PANEL
ALT: 20 U/L (ref 0–53)
AST: 21 U/L (ref 0–37)
Albumin: 3.7 g/dL (ref 3.5–5.2)
Alkaline Phosphatase: 96 U/L (ref 39–117)
BILIRUBIN DIRECT: 0.1 mg/dL (ref 0.0–0.3)
TOTAL PROTEIN: 6.6 g/dL (ref 6.0–8.3)
Total Bilirubin: 0.7 mg/dL (ref 0.2–1.2)

## 2014-02-10 LAB — BASIC METABOLIC PANEL
BUN: 10 mg/dL (ref 6–23)
CALCIUM: 8.9 mg/dL (ref 8.4–10.5)
CO2: 26 mEq/L (ref 19–32)
CREATININE: 1 mg/dL (ref 0.4–1.5)
Chloride: 105 mEq/L (ref 96–112)
GFR: 81.97 mL/min (ref >60.00)
Glucose, Bld: 99 mg/dL (ref 70–99)
Potassium: 4.1 mEq/L (ref 3.5–5.1)
Sodium: 139 mEq/L (ref 135–145)

## 2014-02-10 LAB — CBC WITH DIFFERENTIAL/PLATELET
BASOS PCT: 0.6 % (ref 0.0–3.0)
Basophils Absolute: 0 10*3/uL (ref 0.0–0.1)
EOS ABS: 0.1 10*3/uL (ref 0.0–0.7)
Eosinophils Relative: 1.8 % (ref 0.0–5.0)
HCT: 42.6 % (ref 39.0–52.0)
Hemoglobin: 14.1 g/dL (ref 13.0–17.0)
Lymphocytes Relative: 23.4 % (ref 12.0–46.0)
Lymphs Abs: 1.2 10*3/uL (ref 0.7–4.0)
MCHC: 33.1 g/dL (ref 30.0–36.0)
MCV: 83.5 fl (ref 78.0–100.0)
Monocytes Absolute: 0.3 10*3/uL (ref 0.1–1.0)
Monocytes Relative: 6 % (ref 3.0–12.0)
NEUTROS PCT: 68.2 % (ref 43.0–77.0)
Neutro Abs: 3.5 10*3/uL (ref 1.4–7.7)
PLATELETS: 241 10*3/uL (ref 150.0–400.0)
RBC: 5.11 Mil/uL (ref 4.22–5.81)
RDW: 14.9 % (ref 11.5–15.5)
WBC: 5.2 10*3/uL (ref 4.0–10.5)

## 2014-02-10 LAB — LIPID PANEL
CHOL/HDL RATIO: 6
Cholesterol: 174 mg/dL (ref 0–200)
HDL: 30.7 mg/dL — AB (ref >39.00)
LDL CALC: 107 mg/dL — AB (ref 0–99)
NONHDL: 143.3
Triglycerides: 181 mg/dL — ABNORMAL HIGH (ref 0.0–149.0)
VLDL: 36.2 mg/dL (ref 0.0–40.0)

## 2014-02-10 LAB — TSH: TSH: 1.14 u[IU]/mL (ref 0.35–4.50)

## 2014-02-10 NOTE — Patient Instructions (Signed)
Follow up in 1 year or as needed We'll notify you of your lab results and make any changes if needed Keep up the good work!  You look great! Call with any questions or concerns Happy Labor Day!!! 

## 2014-02-10 NOTE — Progress Notes (Signed)
   Subjective:    Patient ID: Alexander Schroeder, male    DOB: [birth year], 48 y.o.   MRN: [id number]  HPI Pre visit review using our clinic review tool, if applicable. No additional management support is needed unless otherwise documented below in the visit note.  CPE- no concerns.  Due for colonoscopy as he turned 48.   Review of Systems Patient reports no vision/hearing changes, anorexia, fever ,adenopathy, persistant/recurrent hoarseness, swallowing issues, chest pain, palpitations, edema, persistant/recurrent cough, hemoptysis, dyspnea (rest,exertional, paroxysmal nocturnal), gastrointestinal  bleeding (melena, rectal bleeding), abdominal pain, excessive heart burn, GU symptoms (dysuria, hematuria, voiding/incontinence issues) syncope, focal weakness, memory loss, numbness & tingling, skin/hair/nail changes, depression, anxiety, abnormal bruising/bleeding, musculoskeletal symptoms/signs.     Objective:   Physical Exam BP 132/84  Pulse 58  Temp(Src) 98 F (36.7 C) (Oral)  Resp 16  Ht 5' 6.25" (1.683 m)  Wt 197 lb 6 oz (89.529 kg)  BMI 31.61 kg/m2  SpO2 95%  General Appearance:    Alert, cooperative, no distress, appears stated age  Head:    Normocephalic, without obvious abnormality, atraumatic  Eyes:    PERRL, conjunctiva/corneas clear, EOM's intact, fundi    benign, both eyes       Ears:    Normal TM's and external ear canals, both ears  Nose:   Nares normal, septum midline, mucosa normal, no drainage   or sinus tenderness  Throat:   Lips, mucosa, and tongue normal; teeth and gums normal  Neck:   Supple, symmetrical, trachea midline, no adenopathy;       thyroid:  No enlargement/tenderness/nodules  Back:     Symmetric, no curvature, ROM normal, no CVA tenderness  Lungs:     Clear to auscultation bilaterally, respirations unlabored  Chest wall:    No tenderness or deformity  Heart:    Regular rate and rhythm, S1 and S2 normal, no murmur, rub   or gallop  Abdomen:     Soft,  non-tender, bowel sounds active all four quadrants,    no masses, no organomegaly  Genitalia:    Normal male without lesion, discharge or tenderness  Rectal:    Normal tone, normal prostate, no masses or tenderness;   guaiac negative stool  Extremities:   Extremities normal, atraumatic, no cyanosis or edema  Pulses:   2+ and symmetric all extremities  Skin:   Skin color, texture, turgor normal, no rashes or lesions  Lymph nodes:   Cervical, supraclavicular, and axillary nodes normal  Neurologic:   CNII-XII intact. Normal strength, sensation and reflexes      throughout          Assessment & Plan:

## 2014-02-10 NOTE — Assessment & Plan Note (Signed)
Pt's PE WNL.  Due for colonoscopy now that he has turned 48.  Check labs.  Anticipatory guidance provided.

## 2014-02-11 ENCOUNTER — Encounter: Payer: Self-pay | Admitting: Internal Medicine

## 2014-03-24 ENCOUNTER — Other Ambulatory Visit: Payer: Self-pay | Admitting: Family Medicine

## 2014-03-24 NOTE — Telephone Encounter (Signed)
Med filled.  

## 2014-03-28 ENCOUNTER — Ambulatory Visit (AMBULATORY_SURGERY_CENTER): Payer: BC Managed Care – PPO | Admitting: *Deleted

## 2014-03-28 VITALS — Ht 66.0 in | Wt 199.0 lb

## 2014-03-28 DIAGNOSIS — Z1211 Encounter for screening for malignant neoplasm of colon: Secondary | ICD-10-CM

## 2014-03-28 MED ORDER — MOVIPREP 100 G PO SOLR: 1.0000 | Freq: Once | ORAL | Status: DC

## 2014-03-28 NOTE — Progress Notes (Signed)
No egg or soy allergy. ewm No 02 use at home. ewm No diet pills. ewm Only sedation was age 48 w/ wisdom teeth removal, no issues then. ewm Pt declined emmi video. ewm

## 2014-04-11 ENCOUNTER — Encounter: Payer: Self-pay | Admitting: Internal Medicine

## 2014-04-11 ENCOUNTER — Ambulatory Visit (AMBULATORY_SURGERY_CENTER): Payer: BC Managed Care – PPO | Admitting: Internal Medicine

## 2014-04-11 VITALS — BP 119/79 | HR 60 | Temp 96.2°F | Resp 14 | Ht 66.0 in | Wt 199.0 lb

## 2014-04-11 DIAGNOSIS — Z1211 Encounter for screening for malignant neoplasm of colon: Secondary | ICD-10-CM

## 2014-04-11 MED ORDER — SODIUM CHLORIDE 0.9 % IV SOLN: 500.0000 mL | INTRAVENOUS | Status: DC

## 2014-04-11 NOTE — Progress Notes (Signed)
Report to PACU, RN, vss, BBS= Clear.  

## 2014-04-11 NOTE — Patient Instructions (Addendum)
YOU HAD AN ENDOSCOPIC PROCEDURE TODAY AT THE [facility]: Refer to the procedure report that was given to you for any specific questions about what was found during the examination.  If the procedure report does not answer your questions, please call your gastroenterologist to clarify.  If you requested that your care partner not be given the details of your procedure findings, then the procedure report has been included in a sealed envelope for you to review at your convenience later.  YOU SHOULD EXPECT: Some feelings of bloating in the abdomen. Passage of more gas than usual.  Walking can help get rid of the air that was put into your GI tract during the procedure and reduce the bloating. If you had a lower endoscopy (such as a colonoscopy or flexible sigmoidoscopy) you may notice spotting of blood in your stool or on the toilet paper. If you underwent a bowel prep for your procedure, then you may not have a normal bowel movement for a few days.  DIET: Your first meal following the procedure should be a light meal and then it is ok to progress to your normal diet.  A half-sandwich or bowl of soup is an example of a good first meal.  Heavy or fried foods are harder to digest and may make you feel nauseous or bloated.  Likewise meals heavy in dairy and vegetables can cause extra gas to form and this can also increase the bloating.  Drink plenty of fluids but you should avoid alcoholic beverages for 24 hours.  ACTIVITY: Your care partner should take you home directly after the procedure.  You should plan to take it easy, moving slowly for the rest of the day.  You can resume normal activity the day after the procedure however you should NOT DRIVE or use heavy machinery for 24 hours (because of the sedation medicines used during the test).    SYMPTOMS TO REPORT IMMEDIATELY: A gastroenterologist can be reached at any hour.  During normal business hours, 8:30 AM to 5:00 PM Monday through Friday,  call [phone].  After hours and on weekends, please call the GI answering service at [phone] who will take a message and have the physician on call contact you.   Following lower endoscopy (colonoscopy or flexible sigmoidoscopy):  Excessive amounts of blood in the stool  Significant tenderness or worsening of abdominal pains  Swelling of the abdomen that is new, acute  Fever of 100F or higher    FOLLOW UP: If any biopsies were taken you will be contacted by phone or by letter within the next 1-3 weeks.  Call your gastroenterologist if you have not heard about the biopsies in 3 weeks.  Our staff will call the home number listed on your records the next business day following your procedure to check on you and address any questions or concerns that you may have at that time regarding the information given to you following your procedure. This is a courtesy call and so if there is no answer at the home number and we have not heard from you through the emergency physician on call, we will assume that you have returned to your regular daily activities without incident.  SIGNATURES/CONFIDENTIALITY: You and/or your care partner have signed paperwork which will be entered into your electronic medical record.  These signatures attest to the fact that that the information above on your After Visit Summary has been reviewed and is understood.  Full responsibility of the confidentiality   of this discharge information lies with you and/or your care-partner.     

## 2014-04-11 NOTE — Op Note (Signed)
[facility] [address] [place], [id number]   COLONOSCOPY PROCEDURE REPORT  PATIENT: Alexander Schroeder, Alexander Schroeder  MR#: [id number] BIRTHDATE: 03-02-1966 , 48  yrs. old GENDER: male ENDOSCOPIST: Hart Carwinora M Kera Deacon, MD REFERRED BM:[id number]:Katherine Assunta FoundE Tabori, M.D. PROCEDURE DATE:  04/11/2014 PROCEDURE:   Colonoscopy, screening First Screening Colonoscopy - Avg.  risk and is 50 yrs.  old or older Yes.  Prior Negative Screening - Now for repeat screening. N/A  History of Adenoma - Now for follow-up colonoscopy & has been > or = to 3 yrs.  N/A  Polyps Removed Today? No. ASA CLASS:   Class I INDICATIONS:average risk for colon cancer. MEDICATIONS: Monitored anesthesia care and Propofol 240 mg IV  DESCRIPTION OF PROCEDURE:   After the risks benefits and alternatives of the procedure were thoroughly explained, informed consent was obtained.  The digital rectal exam revealed no abnormalities of the rectum.   The LB [id number]  endoscope was introduced through the anus and advanced to the cecum, which was identified by both the appendix and ileocecal valve. No adverse events experienced.   The quality of the prep was good, using MoviPrep  The instrument was then slowly withdrawn as the colon was fully examined.      COLON FINDINGS: A normal appearing cecum, ileocecal valve, and appendiceal orifice were identified.  The ascending, transverse, descending, sigmoid colon, and rectum appeared unremarkable. Retroflexed views revealed no abnormalities. The time to cecum=3 minutes 19 seconds.  Withdrawal time=6 minutes 06 seconds.  The scope was withdrawn and the procedure completed. COMPLICATIONS: There were no immediate complications.  ENDOSCOPIC IMPRESSION: Normal colonoscopy  RECOMMENDATIONS: High fiber diet Recall colonoscopy in 10 years  eSigned:  Hart Carwinora M Nash Bolls, MD 04/11/2014 11:36 AM   cc:

## 2014-04-14 ENCOUNTER — Telehealth: Payer: Self-pay

## 2014-04-14 NOTE — Telephone Encounter (Signed)
  Follow up Call-  Call back number 04/11/2014  Post procedure Call Back phone  # [phone]  Permission to leave phone message Yes     Patient questions:  Do you have a fever, pain , or abdominal swelling? No. Pain Score  0 *  Have you tolerated food without any problems? Yes.    Have you been able to return to your normal activities? Yes.    Do you have any questions about your discharge instructions: Diet   No. Medications  No. Follow up visit  No.  Do you have questions or concerns about your Care? No.  Actions: * If pain score is 4 or above: No action needed, pain <4.

## 2014-05-02 ENCOUNTER — Other Ambulatory Visit: Payer: Self-pay | Admitting: Family Medicine

## 2014-05-02 NOTE — Telephone Encounter (Signed)
Med filled.  

## 2014-07-23 ENCOUNTER — Other Ambulatory Visit: Payer: Self-pay | Admitting: General Practice

## 2014-07-23 MED ORDER — CITALOPRAM HYDROBROMIDE 20 MG PO TABS: 20.0000 mg | ORAL_TABLET | Freq: Every day | ORAL | Status: DC

## 2014-09-12 ENCOUNTER — Encounter: Payer: Self-pay | Admitting: Family Medicine

## 2014-09-12 ENCOUNTER — Ambulatory Visit (INDEPENDENT_AMBULATORY_CARE_PROVIDER_SITE_OTHER): Payer: BC Managed Care – PPO | Admitting: Family Medicine

## 2014-09-12 VITALS — BP 126/82 | HR 69 | Temp 98.0°F | Resp 16 | Wt 189.1 lb

## 2014-09-12 DIAGNOSIS — L723 Sebaceous cyst: Secondary | ICD-10-CM

## 2014-09-12 DIAGNOSIS — L089 Local infection of the skin and subcutaneous tissue, unspecified: Secondary | ICD-10-CM

## 2014-09-12 MED ORDER — CEPHALEXIN 500 MG PO CAPS: 500.0000 mg | ORAL_CAPSULE | Freq: Three times a day (TID) | ORAL | Status: AC

## 2014-09-12 NOTE — Assessment & Plan Note (Signed)
New.  Copious debris removed w/ firm, steady pressure.  + odor but no purulent drainage.  + surrounding cellulitis.  Start Keflex as directed.  Reviewed supportive care and red flags that should prompt return.  Pt expressed understanding and is in agreement w/ plan.

## 2014-09-12 NOTE — Progress Notes (Signed)
   Subjective:    Patient ID: Alexander Schroeder, male    DOB: Sep 05, 1965, 49 y.o.   MRN: [id number]  HPI Bump- pt reports area has been present 'for quite some time and it's never given me any problems'.  Over the last few days became red and raised, had a head on it and wife popped it.  Reports there was a buried hair.  Pt reports area now has pus and 'terrible smell'.  Review of Systems For ROS see HPI     Objective:   Physical Exam  Constitutional: He is oriented to person, place, and time. He appears well-developed and well-nourished. No distress.  Neurological: He is alert and oriented to person, place, and time.  Skin: Skin is warm and dry. There is erythema (surrounding a R mid back sebaceous cyst, no TTP).  Copious debris removed from sebaceous cyst w/ firm, steady pressure  Psychiatric: He has a normal mood and affect. His behavior is normal.  Vitals reviewed.         Assessment & Plan:

## 2014-09-12 NOTE — Progress Notes (Signed)
Pre visit review using our clinic review tool, if applicable. No additional management support is needed unless otherwise documented below in the visit note. 

## 2014-09-12 NOTE — Patient Instructions (Signed)
Follow up as needed This is called a sebaceous cyst and they will recur.  As long as it's not red or painful, there's no need to pop or worry.  The thick drainage isn't pus and the odor is common- not indicative of infection Take the Keflex 3x/day x7 days for the surrounding skin redness Call with any questions or concerns Happy Spring!!!

## 2014-11-19 ENCOUNTER — Other Ambulatory Visit: Payer: Self-pay | Admitting: Family Medicine

## 2014-11-19 NOTE — Telephone Encounter (Signed)
Med filled.  

## 2014-12-06 ENCOUNTER — Ambulatory Visit (INDEPENDENT_AMBULATORY_CARE_PROVIDER_SITE_OTHER): Payer: BC Managed Care – PPO | Admitting: Family Medicine

## 2014-12-06 ENCOUNTER — Other Ambulatory Visit: Payer: Self-pay | Admitting: Family Medicine

## 2014-12-06 VITALS — BP 132/76 | HR 59 | Temp 97.9°F | Resp 12 | Ht 66.5 in | Wt 186.6 lb

## 2014-12-06 DIAGNOSIS — H65192 Other acute nonsuppurative otitis media, left ear: Secondary | ICD-10-CM | POA: Diagnosis not present

## 2014-12-06 MED ORDER — AMOXICILLIN 875 MG PO TABS: 875.0000 mg | ORAL_TABLET | Freq: Two times a day (BID) | ORAL | Status: DC

## 2014-12-06 NOTE — Patient Instructions (Signed)

## 2014-12-06 NOTE — Progress Notes (Signed)
Patient ID: Alexander Schroeder, male   DOB: 1965-06-23, 49 y.o.   MRN: [id number]   This chart was scribed for Elvina Sidle, MD by [facility], medical scribe at [facility].The patient was seen in exam room 11 and the patient's care was started at 11:54 AM.  Patient ID: Alexander Schroeder MRN: [id number], DOB: Jan 09, 1966, 49 y.o. Date of Encounter: 12/06/2014  Primary Physician: Neena Rhymes, MD  Chief Complaint:  Chief Complaint  Patient presents with   Ear Pain    Left ear, pt describes as pressure, started wednesday   Headache    Light headache   Sore Throat    Itchy on the left side   Itchy eyes   Nasal Congestion    started this morning   HPI:  Alexander Schroeder is a 49 y.o. male who presents to [facility] complaining of left ear pressure, and sore throat onset one day ago. He complains of pain with swallowing associated with his sore throat. Pt states eye itching began about two days ago. Several sick contacts at work. He is a Sport and exercise psychologist. Pt is going on a mission trip tomorrow with his church to [place].   Past Medical History  Diagnosis Date   Anxiety     Home Meds: Prior to Admission medications   Medication Sig Start Date End Date Taking? Authorizing Provider  citalopram (CELEXA) 20 MG tablet TAKE 1 TABLET ONCE DAILY. 11/19/14  Yes Sheliah Hatch, MD  ferrous sulfate 325 (65 FE) MG tablet TAKE 1 TABLET EVERY DAY WITH BREAKFAST. 05/02/14  Yes Sheliah Hatch, MD  ALPRAZolam Prudy Feeler) 0.25 MG tablet 1 po tid prn anxiety and panic Patient not taking: Reported on 09/12/2014 06/01/12   Lelon Perla, DO  Omega-3 Fatty Acids (FISH OIL) 300 MG CAPS Take by mouth as directed.      Historical Provider, MD   Allergies: No Known Allergies  History   Social History   Marital Status: Married    Spouse Name: N/A   Number of Children: N/A   Years of Education: N/A   Occupational History   Not on file.   Social  History Main Topics   Smoking status: Never Smoker    Smokeless tobacco: Never Used   Alcohol Use: No   Drug Use: No   Sexual Activity: Not on file   Other Topics Concern   Not on file   Social History Narrative   Lives with wife and 2 kids (1998, 29).          Review of Systems: Constitutional: negative for chills, fever, night sweats, weight changes, or fatigue  HEENT: negative for vision changes, hearing loss, congestion, rhinorrhea, epistaxis, or sinus pressure. Positive for sore throat, and ear pain. Cardiovascular: negative for chest pain or palpitations Respiratory: negative for hemoptysis, wheezing, shortness of breath, or cough Abdominal: negative for abdominal pain, nausea, vomiting, diarrhea, or constipation Dermatological: negative for rash Neurologic: negative for headache, dizziness, or syncope All other systems reviewed and are otherwise negative with the exception to those above and in the HPI.  Physical Exam: Blood pressure 132/76, pulse 59, temperature 97.9 F (36.6 C), temperature source Oral, resp. rate 12, height 5' 6.5" (1.689 m), weight 186 lb 9.6 oz (84.641 kg), SpO2 99 %., Body mass index is 29.67 kg/(m^2). General: Well developed, well nourished, in no acute distress. Head: Normocephalic, atraumatic, eyes without discharge, sclera non-icteric, nares are without discharge. Oral cavity moist, posterior pharynx without  exudate, erythema, peritonsillar abscess, or post nasal drip. Left ear drum is dull and retracted   Neck: Supple. No thyromegaly. Full ROM. No lymphadenopathy. Lungs: Clear bilaterally to auscultation without wheezes, rales, or rhonchi. Breathing is unlabored. Heart: RRR with S1 S2. No murmurs, rubs, or gallops appreciated. Abdomen: Soft, non-tender, non-distended with normoactive bowel sounds. No hepatomegaly. No rebound/guarding. No obvious abdominal masses. Msk:  Strength and tone normal for age. Extremities/Skin: Warm and dry. No  clubbing or cyanosis. No edema. No rashes or suspicious lesions. Neuro: Alert and oriented X 3. Moves all extremities spontaneously. Gait is normal. CNII-XII grossly in tact. Psych:  Responds to questions appropriately with a normal affect.   This chart was scribed in my presence and reviewed by me personally.    ASSESSMENT AND PLAN:  49 y.o. year old male with   Acute nonsuppurative otitis media of left ear - Plan: amoxicillin (AMOXIL) 875 MG tablet   Signed, Elvina Sidle, MD 12/06/2014 11:54 AM

## 2014-12-08 NOTE — Telephone Encounter (Signed)
Med filled.  

## 2015-01-28 ENCOUNTER — Telehealth: Payer: Self-pay | Admitting: Family Medicine

## 2015-01-28 NOTE — Telephone Encounter (Signed)
pre visit letter mailed 01/22/15 °

## 2015-02-11 ENCOUNTER — Telehealth: Payer: Self-pay

## 2015-02-11 NOTE — Telephone Encounter (Signed)
Patient returning your call.

## 2015-02-11 NOTE — Telephone Encounter (Signed)
LMOVM

## 2015-02-12 ENCOUNTER — Encounter: Payer: Self-pay | Admitting: Family Medicine

## 2015-02-12 ENCOUNTER — Ambulatory Visit (INDEPENDENT_AMBULATORY_CARE_PROVIDER_SITE_OTHER): Payer: BC Managed Care – PPO | Admitting: Family Medicine

## 2015-02-12 ENCOUNTER — Telehealth: Payer: Self-pay

## 2015-02-12 VITALS — BP 120/76 | HR 54 | Temp 97.8°F | Resp 16 | Ht 66.0 in | Wt 184.2 lb

## 2015-02-12 DIAGNOSIS — Z0000 Encounter for general adult medical examination without abnormal findings: Secondary | ICD-10-CM

## 2015-02-12 DIAGNOSIS — Z23 Encounter for immunization: Secondary | ICD-10-CM

## 2015-02-12 DIAGNOSIS — R945 Abnormal results of liver function studies: Principal | ICD-10-CM

## 2015-02-12 DIAGNOSIS — R7989 Other specified abnormal findings of blood chemistry: Secondary | ICD-10-CM

## 2015-02-12 LAB — CBC WITH DIFFERENTIAL/PLATELET
BASOS ABS: 0 10*3/uL (ref 0.0–0.1)
Basophils Relative: 0.4 % (ref 0.0–3.0)
Eosinophils Absolute: 0.1 10*3/uL (ref 0.0–0.7)
Eosinophils Relative: 2.2 % (ref 0.0–5.0)
HEMATOCRIT: 43.4 % (ref 39.0–52.0)
Hemoglobin: 14.4 g/dL (ref 13.0–17.0)
LYMPHS ABS: 0.9 10*3/uL (ref 0.7–4.0)
LYMPHS PCT: 21.7 % (ref 12.0–46.0)
MCHC: 33.3 g/dL (ref 30.0–36.0)
MCV: 83.7 fl (ref 78.0–100.0)
MONOS PCT: 8.4 % (ref 3.0–12.0)
Monocytes Absolute: 0.4 10*3/uL (ref 0.1–1.0)
NEUTROS PCT: 67.3 % (ref 43.0–77.0)
Neutro Abs: 2.9 10*3/uL (ref 1.4–7.7)
Platelets: 254 10*3/uL (ref 150.0–400.0)
RBC: 5.18 Mil/uL (ref 4.22–5.81)
RDW: 15 % (ref 11.5–15.5)
WBC: 4.4 10*3/uL (ref 4.0–10.5)

## 2015-02-12 LAB — LIPID PANEL
CHOLESTEROL: 166 mg/dL (ref 0–200)
HDL: 31.6 mg/dL — AB (ref >39.00)
LDL Cholesterol: 106 mg/dL — ABNORMAL HIGH (ref 0–99)
NonHDL: 133.99
Total CHOL/HDL Ratio: 5
Triglycerides: 141 mg/dL (ref 0.0–149.0)
VLDL: 28.2 mg/dL (ref 0.0–40.0)

## 2015-02-12 LAB — BASIC METABOLIC PANEL
BUN: 13 mg/dL (ref 6–23)
CHLORIDE: 104 meq/L (ref 96–112)
CO2: 28 meq/L (ref 19–32)
CREATININE: 1.01 mg/dL (ref 0.40–1.50)
Calcium: 9.4 mg/dL (ref 8.4–10.5)
GFR: 82.58 mL/min (ref >60.00)
Glucose, Bld: 98 mg/dL (ref 70–99)
Potassium: 4.6 mEq/L (ref 3.5–5.1)
Sodium: 139 mEq/L (ref 135–145)

## 2015-02-12 LAB — TSH: TSH: 1.38 u[IU]/mL (ref 0.35–4.50)

## 2015-02-12 LAB — HEPATIC FUNCTION PANEL
ALBUMIN: 4.1 g/dL (ref 3.5–5.2)
ALT: 13 U/L (ref 0–53)
AST: 16 U/L (ref 0–37)
Alkaline Phosphatase: 120 U/L — ABNORMAL HIGH (ref 39–117)
Bilirubin, Direct: 0.1 mg/dL (ref 0.0–0.3)
TOTAL PROTEIN: 6.7 g/dL (ref 6.0–8.3)
Total Bilirubin: 0.6 mg/dL (ref 0.2–1.2)

## 2015-02-12 LAB — PSA: PSA: 0.81 ng/mL (ref 0.10–4.00)

## 2015-02-12 NOTE — Patient Instructions (Signed)
Follow up in 1 year or as needed We'll notify you of your lab results and make any changes if needed Keep up the good work on healthy diet and regular exercise- you look great! Call with any questions or concerns You look great!!!

## 2015-02-12 NOTE — Progress Notes (Signed)
   Subjective:    Patient ID: Alexander Schroeder, male    DOB: Oct 28, 1965, 49 y.o.   MRN: [id number]  HPI CPE- UTD on colonoscopy (Dr Juanda Chance- 2015, due in 2025).  Pt has lost close to 15 lbs in 1 yr.  Pt is counting calories, exercising regularly.   Review of Systems Patient reports no vision/hearing changes, anorexia, fever ,adenopathy, persistant/recurrent hoarseness, swallowing issues, chest pain, palpitations, edema, persistant/recurrent cough, hemoptysis, dyspnea (rest,exertional, paroxysmal nocturnal), gastrointestinal  bleeding (melena, rectal bleeding), abdominal pain, excessive heart burn, GU symptoms (dysuria, hematuria, voiding/incontinence issues) syncope, focal weakness, memory loss, numbness & tingling, skin/hair/nail changes, depression, anxiety, abnormal bruising/bleeding, musculoskeletal symptoms/signs.     Objective:   Physical Exam BP 120/76 mmHg  Pulse 54  Temp(Src) 97.8 F (36.6 C) (Oral)  Resp 16  Ht  (1.676 m)  Wt 184 lb 4 oz (83.575 kg)  BMI 29.75 kg/m2  SpO2 96%  General Appearance:    Alert, cooperative, no distress, appears stated age  Head:    Normocephalic, without obvious abnormality, atraumatic  Eyes:    PERRL, conjunctiva/corneas clear, EOM's intact, fundi    benign, both eyes       Ears:    Normal TM's and external ear canals, both ears  Nose:   Nares normal, septum midline, mucosa normal, no drainage   or sinus tenderness  Throat:   Lips, mucosa, and tongue normal; teeth and gums normal  Neck:   Supple, symmetrical, trachea midline, no adenopathy;       thyroid:  No enlargement/tenderness/nodules  Back:     Symmetric, no curvature, ROM normal, no CVA tenderness  Lungs:     Clear to auscultation bilaterally, respirations unlabored  Chest wall:    No tenderness or deformity  Heart:    Regular rate and rhythm, S1 and S2 normal, no murmur, rub   or gallop  Abdomen:     Soft, non-tender, bowel sounds active all four quadrants,    no masses, no  organomegaly  Genitalia:    Normal male without lesion, discharge or tenderness  Rectal:    Normal tone, normal prostate, no masses or tenderness  Extremities:   Extremities normal, atraumatic, no cyanosis or edema  Pulses:   2+ and symmetric all extremities  Skin:   Skin color, texture, turgor normal, no rashes or lesions  Lymph nodes:   Cervical, supraclavicular, and axillary nodes normal  Neurologic:   CNII-XII intact. Normal strength, sensation and reflexes      throughout          Assessment & Plan:

## 2015-02-12 NOTE — Assessment & Plan Note (Signed)
Pt's PE WNL.  UTD on colonoscopy.  Check labs.  Anticipatory guidance provided.  

## 2015-02-12 NOTE — Telephone Encounter (Signed)
Pt notified has 6 wk f/u lab appointment

## 2015-02-12 NOTE — Progress Notes (Signed)
Pre visit review using our clinic review tool, if applicable. No additional management support is needed unless otherwise documented below in the visit note. 

## 2015-02-12 NOTE — Telephone Encounter (Signed)
-----  Message from Midge Minium, MD sent at 02/12/2015  2:55 PM EDT ----- Labs look good!  Your Alk Phos (a liver enzyme) is up just slightly which is likely nothing, but rather than wait a year to repeat it, I would like to repeat your liver functions at a lab only visit in 6 weeks.

## 2015-02-17 NOTE — Telephone Encounter (Signed)
Labs were scheduled.

## 2015-03-20 ENCOUNTER — Other Ambulatory Visit: Payer: Self-pay | Admitting: Family Medicine

## 2015-03-20 NOTE — Telephone Encounter (Signed)
Rx sent to the pharmacy by e-script.//AB/CMA 

## 2015-03-26 ENCOUNTER — Other Ambulatory Visit (INDEPENDENT_AMBULATORY_CARE_PROVIDER_SITE_OTHER): Payer: BC Managed Care – PPO

## 2015-03-26 DIAGNOSIS — R748 Abnormal levels of other serum enzymes: Secondary | ICD-10-CM | POA: Diagnosis not present

## 2015-03-26 LAB — HEPATIC FUNCTION PANEL
ALT: 13 U/L (ref 0–53)
AST: 15 U/L (ref 0–37)
Albumin: 3.9 g/dL (ref 3.5–5.2)
Alkaline Phosphatase: 110 U/L (ref 39–117)
BILIRUBIN DIRECT: 0.1 mg/dL (ref 0.0–0.3)
BILIRUBIN TOTAL: 0.7 mg/dL (ref 0.2–1.2)
Total Protein: 6.8 g/dL (ref 6.0–8.3)

## 2015-09-14 ENCOUNTER — Other Ambulatory Visit: Payer: Self-pay | Admitting: Family Medicine

## 2015-09-14 NOTE — Telephone Encounter (Signed)
Medication filled to pharmacy as requested.   

## 2015-11-12 ENCOUNTER — Other Ambulatory Visit: Payer: Self-pay | Admitting: Family Medicine

## 2015-11-12 NOTE — Telephone Encounter (Signed)
Pre visit review using our clinic review tool, if applicable. No additional management support is needed unless otherwise documented below in the visit note. 

## 2016-01-13 ENCOUNTER — Other Ambulatory Visit: Payer: Self-pay | Admitting: Family Medicine

## 2016-01-13 NOTE — Telephone Encounter (Signed)
Last oV 02/12/15 Citalopram last filled 09/14/15 #30 with 3

## 2016-02-12 ENCOUNTER — Encounter: Payer: Self-pay | Admitting: Family Medicine

## 2016-02-12 ENCOUNTER — Ambulatory Visit (INDEPENDENT_AMBULATORY_CARE_PROVIDER_SITE_OTHER): Payer: BC Managed Care – PPO | Admitting: Family Medicine

## 2016-02-12 VITALS — BP 121/80 | HR 51 | Temp 98.0°F | Resp 17 | Ht 66.0 in | Wt 177.1 lb

## 2016-02-12 DIAGNOSIS — Z23 Encounter for immunization: Secondary | ICD-10-CM

## 2016-02-12 DIAGNOSIS — Z0000 Encounter for general adult medical examination without abnormal findings: Secondary | ICD-10-CM | POA: Diagnosis not present

## 2016-02-12 LAB — BASIC METABOLIC PANEL
BUN: 16 mg/dL (ref 6–23)
CALCIUM: 8.9 mg/dL (ref 8.4–10.5)
CO2: 30 mEq/L (ref 19–32)
CREATININE: 0.98 mg/dL (ref 0.40–1.50)
Chloride: 102 mEq/L (ref 96–112)
GFR: 85.17 mL/min (ref >60.00)
GLUCOSE: 89 mg/dL (ref 70–99)
POTASSIUM: 4.3 meq/L (ref 3.5–5.1)
Sodium: 138 mEq/L (ref 135–145)

## 2016-02-12 LAB — LIPID PANEL
CHOL/HDL RATIO: 5
CHOLESTEROL: 176 mg/dL (ref 0–200)
HDL: 38.5 mg/dL — AB (ref >39.00)
LDL CALC: 119 mg/dL — AB (ref 0–99)
NonHDL: 137.74
TRIGLYCERIDES: 93 mg/dL (ref 0.0–149.0)
VLDL: 18.6 mg/dL (ref 0.0–40.0)

## 2016-02-12 LAB — CBC WITH DIFFERENTIAL/PLATELET
BASOS ABS: 0 10*3/uL (ref 0.0–0.1)
Basophils Relative: 0.5 % (ref 0.0–3.0)
EOS PCT: 1.3 % (ref 0.0–5.0)
Eosinophils Absolute: 0.1 10*3/uL (ref 0.0–0.7)
HCT: 41.9 % (ref 39.0–52.0)
HEMOGLOBIN: 14.1 g/dL (ref 13.0–17.0)
LYMPHS ABS: 1.2 10*3/uL (ref 0.7–4.0)
Lymphocytes Relative: 22 % (ref 12.0–46.0)
MCHC: 33.7 g/dL (ref 30.0–36.0)
MCV: 83.8 fl (ref 78.0–100.0)
MONOS PCT: 6.2 % (ref 3.0–12.0)
Monocytes Absolute: 0.3 10*3/uL (ref 0.1–1.0)
NEUTROS PCT: 70 % (ref 43.0–77.0)
Neutro Abs: 3.7 10*3/uL (ref 1.4–7.7)
Platelets: 211 10*3/uL (ref 150.0–400.0)
RBC: 5 Mil/uL (ref 4.22–5.81)
RDW: 14.8 % (ref 11.5–15.5)
WBC: 5.3 10*3/uL (ref 4.0–10.5)

## 2016-02-12 LAB — HEPATIC FUNCTION PANEL
ALBUMIN: 4.2 g/dL (ref 3.5–5.2)
ALK PHOS: 115 U/L (ref 39–117)
ALT: 15 U/L (ref 0–53)
AST: 16 U/L (ref 0–37)
Bilirubin, Direct: 0.1 mg/dL (ref 0.0–0.3)
TOTAL PROTEIN: 6.8 g/dL (ref 6.0–8.3)
Total Bilirubin: 0.5 mg/dL (ref 0.2–1.2)

## 2016-02-12 LAB — PSA: PSA: 0.88 ng/mL (ref 0.10–4.00)

## 2016-02-12 LAB — TSH: TSH: 1.04 u[IU]/mL (ref 0.35–4.50)

## 2016-02-12 MED ORDER — CITALOPRAM HYDROBROMIDE 20 MG PO TABS: 20.0000 mg | ORAL_TABLET | Freq: Every day | ORAL | 6 refills | Status: DC

## 2016-02-12 NOTE — Progress Notes (Signed)
Pre visit review using our clinic review tool, if applicable. No additional management support is needed unless otherwise documented below in the visit note. 

## 2016-02-12 NOTE — Assessment & Plan Note (Signed)
Pt's PE WNL.  UTD on colonoscopy.  Flu shot given.  Check labs.  Anticipatory guidance provided.  

## 2016-02-12 NOTE — Patient Instructions (Addendum)
Follow up in 1 year or as needed We'll notify you of your lab results and make any changes if needed Keep up the good work on healthy diet and regular exercise- you look great!! Call with any questions or concerns Happy Labor Day!!! 

## 2016-02-12 NOTE — Progress Notes (Signed)
   Subjective:    Patient ID: Alexander Schroeder, male    DOB: 15-Oct-1965, 50 y.o.   MRN: [id number]  HPI CPE- UTD on colonoscopy.  Pt has lost 7 lbs since last visit- running regularly.     Review of Systems Patient reports no vision/hearing changes, anorexia, fever ,adenopathy, persistant/recurrent hoarseness, swallowing issues, chest pain, palpitations, edema, persistant/recurrent cough, hemoptysis, dyspnea (rest,exertional, paroxysmal nocturnal), gastrointestinal  bleeding (melena, rectal bleeding), abdominal pain, excessive heart burn, GU symptoms (dysuria, hematuria, voiding/incontinence issues) syncope, focal weakness, memory loss, numbness & tingling, skin/hair/nail changes, depression, anxiety, abnormal bruising/bleeding, musculoskeletal symptoms/signs.     Objective:   Physical Exam BP 121/80   Pulse (!) 51   Temp 98 F (36.7 C) (Oral)   Resp 17   Ht 5\' 6"  (1.676 m)   Wt 177 lb 2 oz (80.3 kg)   SpO2 98%   BMI 28.59 kg/m   General Appearance:    Alert, cooperative, no distress, appears stated age  Head:    Normocephalic, without obvious abnormality, atraumatic  Eyes:    PERRL, conjunctiva/corneas clear, EOM's intact, fundi    benign, both eyes       Ears:    Normal TM's and external ear canals, both ears  Nose:   Nares normal, septum midline, mucosa normal, no drainage   or sinus tenderness  Throat:   Lips, mucosa, and tongue normal; teeth and gums normal  Neck:   Supple, symmetrical, trachea midline, no adenopathy;       thyroid:  No enlargement/tenderness/nodules  Back:     Symmetric, no curvature, ROM normal, no CVA tenderness  Lungs:     Clear to auscultation bilaterally, respirations unlabored  Chest wall:    No tenderness or deformity  Heart:    Regular rate and rhythm, S1 and S2 normal, no murmur, rub   or gallop  Abdomen:     Soft, non-tender, bowel sounds active all four quadrants,    no masses, no organomegaly  Genitalia:    Normal male without lesion, discharge  or tenderness  Rectal:    Normal tone, normal prostate, no masses or tenderness  Extremities:   Extremities normal, atraumatic, no cyanosis or edema  Pulses:   2+ and symmetric all extremities  Skin:   Skin color, texture, turgor normal, no rashes or lesions  Lymph nodes:   Cervical, supraclavicular, and axillary nodes normal  Neurologic:   CNII-XII intact. Normal strength, sensation and reflexes      throughout          Assessment & Plan:

## 2016-06-14 ENCOUNTER — Ambulatory Visit (INDEPENDENT_AMBULATORY_CARE_PROVIDER_SITE_OTHER): Payer: BC Managed Care – PPO | Admitting: Physician Assistant

## 2016-06-14 ENCOUNTER — Encounter: Payer: Self-pay | Admitting: Physician Assistant

## 2016-06-14 VITALS — BP 118/80 | HR 67 | Temp 98.6°F | Resp 16 | Ht 66.0 in | Wt 174.0 lb

## 2016-06-14 DIAGNOSIS — Z20828 Contact with and (suspected) exposure to other viral communicable diseases: Secondary | ICD-10-CM | POA: Diagnosis not present

## 2016-06-14 LAB — POCT INFLUENZA A/B
INFLUENZA B, POC: NEGATIVE
Influenza A, POC: NEGATIVE

## 2016-06-14 MED ORDER — FLUTICASONE PROPIONATE 50 MCG/ACT NA SUSP: 2.0000 | Freq: Every day | NASAL | 6 refills | Status: DC

## 2016-06-14 NOTE — Progress Notes (Signed)
   Patient presents to clinic today c/o 1.5 days of dry cough, aches, chills and bilateral ear pain. Is unsure of fever but has not felt feverish. Denies sinus pressure, PND. Notes sore throat. Denies recent travel. Endorses several co-workers with the flu. Patient has had the flu shot. Denies history of asthma or COPD. Is not a smoker. Is taking Dayquil/Nyquil for symptom relief.   Past Medical History:  Diagnosis Date  . Anxiety     Current Outpatient Prescriptions on File Prior to Visit  Medication Sig Dispense Refill  . citalopram (CELEXA) 20 MG tablet Take 1 tablet (20 mg total) by mouth daily. 30 tablet 6  . ferrous sulfate 325 (65 FE) MG tablet TAKE 1 TABLET EVERY DAY WITH BREAKFAST. 30 tablet 6  . Omega-3 Fatty Acids (FISH OIL) 300 MG CAPS Take by mouth as directed.       No current facility-administered medications on file prior to visit.     No Known Allergies  Family History  Problem Relation Age of Onset  . Heart disease Maternal Grandmother   . Diabetes Maternal Grandfather   . Diabetes Paternal Grandmother   . Heart disease Paternal Grandfather   . Breast cancer Mother   . Cancer Father   . Diabetes Father   . Colon cancer Neg Hx   . Rectal cancer Neg Hx   . Stomach cancer Neg Hx   . Esophageal cancer Neg Hx     Social History   Social History  . Marital status: Married    Spouse name: N/A  . Number of children: N/A  . Years of education: N/A   Social History Main Topics  . Smoking status: Never Smoker  . Smokeless tobacco: Never Used  . Alcohol use No  . Drug use: No  . Sexual activity: Not Asked   Other Topics Concern  . None   Social History Narrative   Lives with wife and 2 kids (1998, [id number]).         Review of Systems - See HPI.  All other ROS are negative.  BP 118/80   Pulse 67   Temp 98.6 F (37 C) (Oral)   Resp 16   Ht 5\' 6"  (1.676 m)   Wt 174 lb (78.9 kg)   SpO2 98%   BMI 28.08 kg/m   Physical Exam  Constitutional: He is  oriented to person, place, and time and well-developed, well-nourished, and in no distress.  HENT:  Head: Normocephalic and atraumatic.  Right Ear: External ear normal.  Left Ear: External ear normal.  Nose: Nose normal.  Mouth/Throat: Oropharynx is clear and moist. No oropharyngeal exudate.  Eyes: Conjunctivae are normal.  Neck: Neck supple.  Cardiovascular: Normal rate, regular rhythm, normal heart sounds and intact distal pulses.   Pulmonary/Chest: Effort normal and breath sounds normal. No respiratory distress. He has no wheezes. He has no rales. He exhibits no tenderness.  Lymphadenopathy:    He has no cervical adenopathy.  Neurological: He is alert and oriented to person, place, and time.  Skin: Skin is warm and dry. No rash noted.  Psychiatric: Affect normal.  Vitals reviewed.  Assessment/Plan: 1. Exposure to the flu Flu swab negative. Potential mild flu symptoms versus other viral URI. Examination unremarkable. Afebrile. Discussed risks and benefits of [place]. Declines at present. Supportive measures and OTC medications reviewed. FU discussed.  - POCT Influenza A/B   Piedad ClimesMartin, Tonae Livolsi Cody, PA-C

## 2016-06-14 NOTE — Patient Instructions (Signed)
Your flu swab is negative.  As discussed there was exposure to the flu and you do have some symptoms although milder than what is usually seen.  You have declined Tamiflu which I feel is very reasonable.  Please stay well hydrated and get plenty of rest. Continue your Dayquil/Nyquil regimen.   Use the Flonase as directed.  Follow-up if symptoms are not resolving in 4-5 days or if new symptoms develop.

## 2016-06-14 NOTE — Progress Notes (Signed)
Pre visit review using our clinic review tool, if applicable. No additional management support is needed unless otherwise documented below in the visit note. 

## 2016-09-09 ENCOUNTER — Other Ambulatory Visit: Payer: Self-pay | Admitting: Family Medicine

## 2016-11-09 ENCOUNTER — Other Ambulatory Visit: Payer: Self-pay | Admitting: Family Medicine

## 2017-01-09 ENCOUNTER — Other Ambulatory Visit: Payer: Self-pay | Admitting: Family Medicine

## 2017-02-15 ENCOUNTER — Ambulatory Visit (INDEPENDENT_AMBULATORY_CARE_PROVIDER_SITE_OTHER): Payer: BC Managed Care – PPO | Admitting: Family Medicine

## 2017-02-15 ENCOUNTER — Encounter: Payer: Self-pay | Admitting: General Practice

## 2017-02-15 ENCOUNTER — Encounter: Payer: Self-pay | Admitting: Family Medicine

## 2017-02-15 VITALS — BP 113/78 | HR 80 | Temp 98.1°F | Resp 16 | Ht 66.0 in | Wt 171.0 lb

## 2017-02-15 DIAGNOSIS — E785 Hyperlipidemia, unspecified: Secondary | ICD-10-CM | POA: Diagnosis not present

## 2017-02-15 DIAGNOSIS — Z125 Encounter for screening for malignant neoplasm of prostate: Secondary | ICD-10-CM

## 2017-02-15 DIAGNOSIS — Z0000 Encounter for general adult medical examination without abnormal findings: Secondary | ICD-10-CM | POA: Diagnosis not present

## 2017-02-15 DIAGNOSIS — Z23 Encounter for immunization: Secondary | ICD-10-CM | POA: Diagnosis not present

## 2017-02-15 LAB — BASIC METABOLIC PANEL
BUN: 13 mg/dL (ref 6–23)
CO2: 27 mEq/L (ref 19–32)
CREATININE: 1.03 mg/dL (ref 0.40–1.50)
Calcium: 9.2 mg/dL (ref 8.4–10.5)
Chloride: 103 mEq/L (ref 96–112)
GFR: 80.11 mL/min (ref >60.00)
Glucose, Bld: 93 mg/dL (ref 70–99)
Potassium: 4 mEq/L (ref 3.5–5.1)
Sodium: 138 mEq/L (ref 135–145)

## 2017-02-15 LAB — LIPID PANEL
CHOL/HDL RATIO: 4
Cholesterol: 168 mg/dL (ref 0–200)
HDL: 38 mg/dL — AB (ref >39.00)
LDL Cholesterol: 111 mg/dL — ABNORMAL HIGH (ref 0–99)
NonHDL: 129.57
TRIGLYCERIDES: 94 mg/dL (ref 0.0–149.0)
VLDL: 18.8 mg/dL (ref 0.0–40.0)

## 2017-02-15 LAB — HEPATIC FUNCTION PANEL
ALBUMIN: 4.1 g/dL (ref 3.5–5.2)
ALT: 13 U/L (ref 0–53)
AST: 17 U/L (ref 0–37)
Alkaline Phosphatase: 98 U/L (ref 39–117)
Bilirubin, Direct: 0.1 mg/dL (ref 0.0–0.3)
Total Bilirubin: 0.5 mg/dL (ref 0.2–1.2)
Total Protein: 6.4 g/dL (ref 6.0–8.3)

## 2017-02-15 LAB — CBC WITH DIFFERENTIAL/PLATELET
Basophils Absolute: 0 10*3/uL (ref 0.0–0.1)
Basophils Relative: 0.7 % (ref 0.0–3.0)
EOS PCT: 2.7 % (ref 0.0–5.0)
Eosinophils Absolute: 0.1 10*3/uL (ref 0.0–0.7)
HCT: 41.1 % (ref 39.0–52.0)
HEMOGLOBIN: 13.6 g/dL (ref 13.0–17.0)
Lymphocytes Relative: 24.6 % (ref 12.0–46.0)
Lymphs Abs: 1 10*3/uL (ref 0.7–4.0)
MCHC: 33.2 g/dL (ref 30.0–36.0)
MCV: 86.2 fl (ref 78.0–100.0)
MONO ABS: 0.3 10*3/uL (ref 0.1–1.0)
MONOS PCT: 7.2 % (ref 3.0–12.0)
Neutro Abs: 2.5 10*3/uL (ref 1.4–7.7)
Neutrophils Relative %: 64.8 % (ref 43.0–77.0)
Platelets: 216 10*3/uL (ref 150.0–400.0)
RBC: 4.76 Mil/uL (ref 4.22–5.81)
RDW: 13.9 % (ref 11.5–15.5)
WBC: 3.9 10*3/uL — AB (ref 4.0–10.5)

## 2017-02-15 LAB — TSH: TSH: 1.67 u[IU]/mL (ref 0.35–4.50)

## 2017-02-15 LAB — PSA: PSA: 0.93 ng/mL (ref 0.10–4.00)

## 2017-02-15 NOTE — Progress Notes (Signed)
Pre visit review using our clinic review tool, if applicable. No additional management support is needed unless otherwise documented below in the visit note. 

## 2017-02-15 NOTE — Assessment & Plan Note (Signed)
Pt's PE WNL.  UTD on colonoscopy and Tdap.  Flu shot given today.  Check labs.  Anticipatory guidance provided.  

## 2017-02-15 NOTE — Patient Instructions (Signed)
Follow up in 1 year or as needed We'll notify you of your lab results and make any changes if needed Continue to work on healthy diet and regular exercise- you look great! Call with any questions or concerns Happy Fall!! 

## 2017-02-15 NOTE — Progress Notes (Signed)
   Subjective:    Patient ID: Terrace Arabiaatrick Kerkhoff, male    DOB: 09-06-1965, 51 y.o.   MRN: [id number]  HPI CPE- UTD on colonoscopy, Tdap.  Pt has lost another 3 lbs!  Exercising regularly.  To get flu shot today.  No concerns today.   Review of Systems Patient reports no vision/hearing changes, anorexia, fever ,adenopathy, persistant/recurrent hoarseness, swallowing issues, chest pain, palpitations, edema, persistant/recurrent cough, hemoptysis, dyspnea (rest,exertional, paroxysmal nocturnal), gastrointestinal  bleeding (melena, rectal bleeding), abdominal pain, excessive heart burn, GU symptoms (dysuria, hematuria, voiding/incontinence issues) syncope, focal weakness, memory loss, numbness & tingling, skin/hair/nail changes, depression, anxiety, abnormal bruising/bleeding, musculoskeletal symptoms/signs.     Objective:   Physical Exam BP 113/78   Pulse 80   Temp 98.1 F (36.7 C) (Oral)   Resp 16   Ht 5\' 6"  (1.676 m)   Wt 171 lb (77.6 kg)   SpO2 98%   BMI 27.60 kg/m   General Appearance:    Alert, cooperative, no distress, appears stated age  Head:    Normocephalic, without obvious abnormality, atraumatic  Eyes:    PERRL, conjunctiva/corneas clear, EOM's intact, fundi    benign, both eyes       Ears:    Normal TM's and external ear canals, both ears  Nose:   Nares normal, septum midline, mucosa normal, no drainage   or sinus tenderness  Throat:   Lips, mucosa, and tongue normal; teeth and gums normal  Neck:   Supple, symmetrical, trachea midline, no adenopathy;       thyroid:  No enlargement/tenderness/nodules  Back:     Symmetric, no curvature, ROM normal, no CVA tenderness  Lungs:     Clear to auscultation bilaterally, respirations unlabored  Chest wall:    No tenderness or deformity  Heart:    Regular rate and rhythm, S1 and S2 normal, no murmur, rub   or gallop  Abdomen:     Soft, non-tender, bowel sounds active all four quadrants,    no masses, no organomegaly  Genitalia:     Normal male without lesion, discharge or tenderness  Rectal:    Normal tone, normal prostate, no masses or tenderness  Extremities:   Extremities normal, atraumatic, no cyanosis or edema  Pulses:   2+ and symmetric all extremities  Skin:   Skin color, texture, turgor normal, no rashes or lesions  Lymph nodes:   Cervical, supraclavicular, and axillary nodes normal  Neurologic:   CNII-XII intact. Normal strength, sensation and reflexes      throughout          Assessment & Plan:

## 2017-08-11 ENCOUNTER — Other Ambulatory Visit: Payer: Self-pay | Admitting: Family Medicine

## 2017-12-08 ENCOUNTER — Other Ambulatory Visit: Payer: Self-pay | Admitting: Family Medicine

## 2018-01-25 ENCOUNTER — Other Ambulatory Visit: Payer: Self-pay | Admitting: Family Medicine

## 2018-02-16 ENCOUNTER — Ambulatory Visit (INDEPENDENT_AMBULATORY_CARE_PROVIDER_SITE_OTHER): Payer: BC Managed Care – PPO | Admitting: Family Medicine

## 2018-02-16 ENCOUNTER — Encounter: Payer: Self-pay | Admitting: Family Medicine

## 2018-02-16 ENCOUNTER — Other Ambulatory Visit: Payer: Self-pay

## 2018-02-16 VITALS — BP 120/82 | HR 54 | Temp 98.0°F | Resp 15 | Ht 65.75 in | Wt 168.8 lb

## 2018-02-16 DIAGNOSIS — Z23 Encounter for immunization: Secondary | ICD-10-CM | POA: Diagnosis not present

## 2018-02-16 DIAGNOSIS — E663 Overweight: Secondary | ICD-10-CM

## 2018-02-16 DIAGNOSIS — Z125 Encounter for screening for malignant neoplasm of prostate: Secondary | ICD-10-CM | POA: Diagnosis not present

## 2018-02-16 DIAGNOSIS — Z0000 Encounter for general adult medical examination without abnormal findings: Secondary | ICD-10-CM

## 2018-02-16 LAB — CBC WITH DIFFERENTIAL/PLATELET
Basophils Absolute: 0 10*3/uL (ref 0.0–0.1)
Basophils Relative: 0.6 % (ref 0.0–3.0)
EOS PCT: 2.7 % (ref 0.0–5.0)
Eosinophils Absolute: 0.1 10*3/uL (ref 0.0–0.7)
HEMATOCRIT: 45.1 % (ref 39.0–52.0)
HEMOGLOBIN: 15.1 g/dL (ref 13.0–17.0)
LYMPHS PCT: 22.4 % (ref 12.0–46.0)
Lymphs Abs: 0.9 10*3/uL (ref 0.7–4.0)
MCHC: 33.5 g/dL (ref 30.0–36.0)
MCV: 84.2 fl (ref 78.0–100.0)
MONO ABS: 0.3 10*3/uL (ref 0.1–1.0)
Monocytes Relative: 6.9 % (ref 3.0–12.0)
Neutro Abs: 2.6 10*3/uL (ref 1.4–7.7)
Neutrophils Relative %: 67.4 % (ref 43.0–77.0)
Platelets: 221 10*3/uL (ref 150.0–400.0)
RBC: 5.36 Mil/uL (ref 4.22–5.81)
RDW: 14.1 % (ref 11.5–15.5)
WBC: 3.9 10*3/uL — AB (ref 4.0–10.5)

## 2018-02-16 LAB — HEPATIC FUNCTION PANEL
ALBUMIN: 4.1 g/dL (ref 3.5–5.2)
ALT: 14 U/L (ref 0–53)
AST: 15 U/L (ref 0–37)
Alkaline Phosphatase: 103 U/L (ref 39–117)
Bilirubin, Direct: 0.1 mg/dL (ref 0.0–0.3)
Total Bilirubin: 0.6 mg/dL (ref 0.2–1.2)
Total Protein: 6.5 g/dL (ref 6.0–8.3)

## 2018-02-16 LAB — LIPID PANEL
CHOL/HDL RATIO: 4
Cholesterol: 162 mg/dL (ref 0–200)
HDL: 37.2 mg/dL — ABNORMAL LOW (ref >39.00)
LDL CALC: 108 mg/dL — AB (ref 0–99)
NONHDL: 124.36
TRIGLYCERIDES: 83 mg/dL (ref 0.0–149.0)
VLDL: 16.6 mg/dL (ref 0.0–40.0)

## 2018-02-16 LAB — BASIC METABOLIC PANEL
BUN: 16 mg/dL (ref 6–23)
CALCIUM: 9 mg/dL (ref 8.4–10.5)
CO2: 31 mEq/L (ref 19–32)
Chloride: 105 mEq/L (ref 96–112)
Creatinine, Ser: 1.12 mg/dL (ref 0.40–1.50)
GFR: 72.45 mL/min (ref >60.00)
Glucose, Bld: 101 mg/dL — ABNORMAL HIGH (ref 70–99)
Potassium: 4.5 mEq/L (ref 3.5–5.1)
Sodium: 141 mEq/L (ref 135–145)

## 2018-02-16 LAB — PSA: PSA: 4.17 ng/mL — AB (ref 0.10–4.00)

## 2018-02-16 LAB — TSH: TSH: 1.47 u[IU]/mL (ref 0.35–4.50)

## 2018-02-16 NOTE — Assessment & Plan Note (Signed)
Pt's PE WNL.  UTD on colonoscopy, Tdap.  Flu shot given.  Check labs.  Anticipatory guidance provided.  

## 2018-02-16 NOTE — Patient Instructions (Signed)
Follow up in 1 year or as needed We'll notify you of your lab results and make any changes if needed Keep up the good work on healthy diet and regular exercise- you look great! Call with any questions or concerns Happy Fall!!! 

## 2018-02-16 NOTE — Progress Notes (Signed)
   Subjective:    Patient ID: Alexander Schroeder, male    DOB: [birth year], 52 y.o.   MRN: [id number]  HPI CPE- UTD on colonoscopy, Tdap, will get flu today.  No concerns today.   Review of Systems Patient reports no vision/hearing changes, anorexia, fever ,adenopathy, persistant/recurrent hoarseness, swallowing issues, chest pain, palpitations, edema, persistant/recurrent cough, hemoptysis, dyspnea (rest,exertional, paroxysmal nocturnal), gastrointestinal  bleeding (melena, rectal bleeding), abdominal pain, excessive heart burn, GU symptoms (dysuria, hematuria, voiding/incontinence issues) syncope, focal weakness, memory loss, numbness & tingling, skin/hair/nail changes, depression, anxiety, abnormal bruising/bleeding, musculoskeletal symptoms/signs.     Objective:   Physical Exam BP 136/88   Pulse (!) 54   Temp 98 F (36.7 C) (Oral)   Resp 15   Ht 5' 5.75" (1.67 m)   Wt 168 lb 12.8 oz (76.6 kg)   SpO2 97%   BMI 27.45 kg/m   General Appearance:    Alert, cooperative, no distress, appears stated age  Head:    Normocephalic, without obvious abnormality, atraumatic  Eyes:    PERRL, conjunctiva/corneas clear, EOM's intact, fundi    benign, both eyes       Ears:    Normal TM's and external ear canals, both ears  Nose:   Nares normal, septum midline, mucosa normal, no drainage   or sinus tenderness  Throat:   Lips, mucosa, and tongue normal; teeth and gums normal  Neck:   Supple, symmetrical, trachea midline, no adenopathy;       thyroid:  No enlargement/tenderness/nodules  Back:     Symmetric, no curvature, ROM normal, no CVA tenderness  Lungs:     Clear to auscultation bilaterally, respirations unlabored  Chest wall:    No tenderness or deformity  Heart:    Regular rate and rhythm, S1 and S2 normal, no murmur, rub   or gallop  Abdomen:     Soft, non-tender, bowel sounds active all four quadrants,    no masses, no organomegaly  Genitalia:    Normal male without lesion, discharge or  tenderness  Rectal:    Normal tone, normal prostate, no masses or tenderness  Extremities:   Extremities normal, atraumatic, no cyanosis or edema  Pulses:   2+ and symmetric all extremities  Skin:   Skin color, texture, turgor normal, no rashes or lesions  Lymph nodes:   Cervical, supraclavicular, and axillary nodes normal  Neurologic:   CNII-XII intact. Normal strength, sensation and reflexes      throughout          Assessment & Plan:

## 2018-02-19 ENCOUNTER — Other Ambulatory Visit: Payer: Self-pay | Admitting: Family Medicine

## 2018-02-19 DIAGNOSIS — R972 Elevated prostate specific antigen [PSA]: Secondary | ICD-10-CM

## 2018-03-16 ENCOUNTER — Other Ambulatory Visit: Payer: Self-pay | Admitting: Family Medicine

## 2018-03-20 ENCOUNTER — Other Ambulatory Visit (INDEPENDENT_AMBULATORY_CARE_PROVIDER_SITE_OTHER): Payer: BC Managed Care – PPO

## 2018-03-20 DIAGNOSIS — R972 Elevated prostate specific antigen [PSA]: Secondary | ICD-10-CM

## 2018-03-20 LAB — PSA: PSA: 1.59 ng/mL (ref 0.10–4.00)

## 2018-04-18 ENCOUNTER — Other Ambulatory Visit: Payer: Self-pay | Admitting: Family Medicine

## 2018-11-25 ENCOUNTER — Other Ambulatory Visit: Payer: Self-pay | Admitting: Family Medicine

## 2019-02-22 ENCOUNTER — Encounter: Payer: BC Managed Care – PPO | Admitting: Family Medicine

## 2019-03-22 ENCOUNTER — Ambulatory Visit (INDEPENDENT_AMBULATORY_CARE_PROVIDER_SITE_OTHER): Payer: BC Managed Care – PPO | Admitting: Family Medicine

## 2019-03-22 ENCOUNTER — Other Ambulatory Visit: Payer: Self-pay

## 2019-03-22 ENCOUNTER — Encounter: Payer: Self-pay | Admitting: Family Medicine

## 2019-03-22 VITALS — BP 123/80 | HR 52 | Temp 98.0°F | Resp 16 | Ht 66.0 in | Wt 169.1 lb

## 2019-03-22 DIAGNOSIS — E663 Overweight: Secondary | ICD-10-CM | POA: Diagnosis not present

## 2019-03-22 DIAGNOSIS — Z0000 Encounter for general adult medical examination without abnormal findings: Secondary | ICD-10-CM | POA: Diagnosis not present

## 2019-03-22 DIAGNOSIS — Z23 Encounter for immunization: Secondary | ICD-10-CM

## 2019-03-22 DIAGNOSIS — Z125 Encounter for screening for malignant neoplasm of prostate: Secondary | ICD-10-CM

## 2019-03-22 LAB — CBC WITH DIFFERENTIAL/PLATELET
Basophils Absolute: 0 10*3/uL (ref 0.0–0.1)
Basophils Relative: 0.9 % (ref 0.0–3.0)
Eosinophils Absolute: 0.1 10*3/uL (ref 0.0–0.7)
Eosinophils Relative: 1.4 % (ref 0.0–5.0)
HCT: 47.4 % (ref 39.0–52.0)
Hemoglobin: 15.8 g/dL (ref 13.0–17.0)
Lymphocytes Relative: 25.6 % (ref 12.0–46.0)
Lymphs Abs: 1 10*3/uL (ref 0.7–4.0)
MCHC: 33.3 g/dL (ref 30.0–36.0)
MCV: 84.7 fl (ref 78.0–100.0)
Monocytes Absolute: 0.3 10*3/uL (ref 0.1–1.0)
Monocytes Relative: 7.1 % (ref 3.0–12.0)
Neutro Abs: 2.5 10*3/uL (ref 1.4–7.7)
Neutrophils Relative %: 65 % (ref 43.0–77.0)
Platelets: 199 10*3/uL (ref 150.0–400.0)
RBC: 5.59 Mil/uL (ref 4.22–5.81)
RDW: 13.6 % (ref 11.5–15.5)
WBC: 3.9 10*3/uL — ABNORMAL LOW (ref 4.0–10.5)

## 2019-03-22 LAB — LIPID PANEL
Cholesterol: 177 mg/dL (ref 0–200)
HDL: 39.7 mg/dL (ref >39.00)
LDL Cholesterol: 120 mg/dL — ABNORMAL HIGH (ref 0–99)
NonHDL: 137.1
Total CHOL/HDL Ratio: 4
Triglycerides: 87 mg/dL (ref 0.0–149.0)
VLDL: 17.4 mg/dL (ref 0.0–40.0)

## 2019-03-22 LAB — BASIC METABOLIC PANEL
BUN: 16 mg/dL (ref 6–23)
CO2: 31 mEq/L (ref 19–32)
Calcium: 9.5 mg/dL (ref 8.4–10.5)
Chloride: 101 mEq/L (ref 96–112)
Creatinine, Ser: 1.16 mg/dL (ref 0.40–1.50)
GFR: 65.2 mL/min (ref >60.00)
Glucose, Bld: 95 mg/dL (ref 70–99)
Potassium: 4.5 mEq/L (ref 3.5–5.1)
Sodium: 138 mEq/L (ref 135–145)

## 2019-03-22 LAB — HEPATIC FUNCTION PANEL
ALT: 15 U/L (ref 0–53)
AST: 19 U/L (ref 0–37)
Albumin: 4.3 g/dL (ref 3.5–5.2)
Alkaline Phosphatase: 107 U/L (ref 39–117)
Bilirubin, Direct: 0.1 mg/dL (ref 0.0–0.3)
Total Bilirubin: 0.9 mg/dL (ref 0.2–1.2)
Total Protein: 6.9 g/dL (ref 6.0–8.3)

## 2019-03-22 LAB — TSH: TSH: 1.25 u[IU]/mL (ref 0.35–4.50)

## 2019-03-22 LAB — PSA: PSA: 0.99 ng/mL (ref 0.10–4.00)

## 2019-03-22 NOTE — Assessment & Plan Note (Signed)
Pt's PE WNL.  UTD on colonoscopy, Tdap.  Flu given.  Check labs.  Anticipatory guidance provided.

## 2019-03-22 NOTE — Patient Instructions (Signed)
Follow up in 1 year or as needed We'll notify you of your lab results and make any changes if needed Keep up the good work on healthy diet and regular exercise- you look great!!! Call with any questions or concerns Stay Safe!!! 

## 2019-03-22 NOTE — Progress Notes (Signed)
   Subjective:    Patient ID: Alexander Schroeder, male    DOB: 05/20/1966, 53 y.o.   MRN: [id number]  HPI CPE- UTD on colonoscopy, Tdap.  Due for flu.   Review of Systems Patient reports no vision/hearing changes, anorexia, fever ,adenopathy, persistant/recurrent hoarseness, swallowing issues, chest pain, palpitations, edema, persistant/recurrent cough, hemoptysis, dyspnea (rest,exertional, paroxysmal nocturnal), gastrointestinal  bleeding (melena, rectal bleeding), abdominal pain, excessive heart burn, GU symptoms (dysuria, hematuria, voiding/incontinence issues) syncope, focal weakness, memory loss, numbness & tingling, skin/hair/nail changes, depression, anxiety, abnormal bruising/bleeding, musculoskeletal symptoms/signs.     Objective:   Physical Exam BP 123/80   Pulse (!) 52   Temp 98 F (36.7 C) (Tympanic)   Resp 16   Ht 5\' 6"  (1.676 m)   Wt 169 lb 2 oz (76.7 kg)   SpO2 97%   BMI 27.30 kg/m   General Appearance:    Alert, cooperative, no distress, appears stated age  Head:    Normocephalic, without obvious abnormality, atraumatic  Eyes:    PERRL, conjunctiva/corneas clear, EOM's intact, fundi    benign, both eyes       Ears:    Normal TM's and external ear canals, both ears  Nose:   Deferred due to COVID  Throat:   Neck:   Supple, symmetrical, trachea midline, no adenopathy;       thyroid:  No enlargement/tenderness/nodules  Back:     Symmetric, no curvature, ROM normal, no CVA tenderness  Lungs:     Clear to auscultation bilaterally, respirations unlabored  Chest wall:    No tenderness or deformity  Heart:    Regular rate and rhythm, S1 and S2 normal, no murmur, rub   or gallop  Abdomen:     Soft, non-tender, bowel sounds active all four quadrants,    no masses, no organomegaly  Genitalia:    Normal male without lesion, discharge or tenderness  Rectal:    Normal tone, normal prostate, no masses or tenderness  Extremities:   Extremities normal, atraumatic, no cyanosis or  edema  Pulses:   2+ and symmetric all extremities  Skin:   Skin color, texture, turgor normal, no rashes or lesions  Lymph nodes:   Cervical, supraclavicular, and axillary nodes normal  Neurologic:   CNII-XII intact. Normal strength, sensation and reflexes      throughout          Assessment & Plan:

## 2019-04-28 ENCOUNTER — Emergency Department (HOSPITAL_COMMUNITY): Payer: BC Managed Care – PPO

## 2019-04-28 ENCOUNTER — Encounter (HOSPITAL_COMMUNITY): Payer: Self-pay | Admitting: Emergency Medicine

## 2019-04-28 ENCOUNTER — Other Ambulatory Visit: Payer: Self-pay

## 2019-04-28 ENCOUNTER — Emergency Department (HOSPITAL_COMMUNITY)
Admission: EM | Admit: 2019-04-28 | Discharge: 2019-04-28 | Disposition: A | Discharge status: Home / Self Care | Payer: BC Managed Care – PPO | Attending: Emergency Medicine | Admitting: Emergency Medicine

## 2019-04-28 DIAGNOSIS — Y9389 Activity, other specified: Secondary | ICD-10-CM | POA: Insufficient documentation

## 2019-04-28 DIAGNOSIS — S32028A Other fracture of second lumbar vertebra, initial encounter for closed fracture: Secondary | ICD-10-CM | POA: Insufficient documentation

## 2019-04-28 DIAGNOSIS — S3992XA Unspecified injury of lower back, initial encounter: Secondary | ICD-10-CM | POA: Diagnosis present

## 2019-04-28 DIAGNOSIS — W11XXXA Fall on and from ladder, initial encounter: Secondary | ICD-10-CM | POA: Insufficient documentation

## 2019-04-28 DIAGNOSIS — W19XXXA Unspecified fall, initial encounter: Secondary | ICD-10-CM

## 2019-04-28 DIAGNOSIS — S298XXA Other specified injuries of thorax, initial encounter: Secondary | ICD-10-CM | POA: Insufficient documentation

## 2019-04-28 DIAGNOSIS — R55 Syncope and collapse: Secondary | ICD-10-CM | POA: Insufficient documentation

## 2019-04-28 DIAGNOSIS — S32018A Other fracture of first lumbar vertebra, initial encounter for closed fracture: Secondary | ICD-10-CM | POA: Insufficient documentation

## 2019-04-28 DIAGNOSIS — S32038A Other fracture of third lumbar vertebra, initial encounter for closed fracture: Secondary | ICD-10-CM | POA: Insufficient documentation

## 2019-04-28 DIAGNOSIS — Y92008 Other place in unspecified non-institutional (private) residence as the place of occurrence of the external cause: Secondary | ICD-10-CM | POA: Diagnosis not present

## 2019-04-28 DIAGNOSIS — Y999 Unspecified external cause status: Secondary | ICD-10-CM | POA: Insufficient documentation

## 2019-04-28 DIAGNOSIS — Z79899 Other long term (current) drug therapy: Secondary | ICD-10-CM | POA: Insufficient documentation

## 2019-04-28 DIAGNOSIS — M549 Dorsalgia, unspecified: Secondary | ICD-10-CM

## 2019-04-28 DIAGNOSIS — S32009A Unspecified fracture of unspecified lumbar vertebra, initial encounter for closed fracture: Secondary | ICD-10-CM

## 2019-04-28 LAB — COMPREHENSIVE METABOLIC PANEL
ALT: 25 U/L (ref 0–44)
AST: 35 U/L (ref 15–41)
Albumin: 3.8 g/dL (ref 3.5–5.0)
Alkaline Phosphatase: 93 U/L (ref 38–126)
Anion gap: 12 (ref 5–15)
BUN: 20 mg/dL (ref 6–20)
CO2: 22 mmol/L (ref 22–32)
Calcium: 9.2 mg/dL (ref 8.9–10.3)
Chloride: 105 mmol/L (ref 98–111)
Creatinine, Ser: 1.2 mg/dL (ref 0.61–1.24)
GFR calc Af Amer: >60 mL/min (ref >60)
GFR calc non Af Amer: >60 mL/min (ref >60)
Glucose, Bld: 124 mg/dL — ABNORMAL HIGH (ref 70–99)
Potassium: 4.8 mmol/L (ref 3.5–5.1)
Sodium: 139 mmol/L (ref 135–145)
Total Bilirubin: 1.3 mg/dL — ABNORMAL HIGH (ref 0.3–1.2)
Total Protein: 6.7 g/dL (ref 6.5–8.1)

## 2019-04-28 LAB — CBC WITH DIFFERENTIAL/PLATELET
Abs Immature Granulocytes: 0.04 10*3/uL (ref 0.00–0.07)
Basophils Absolute: 0 10*3/uL (ref 0.0–0.1)
Basophils Relative: 0 %
Eosinophils Absolute: 0 10*3/uL (ref 0.0–0.5)
Eosinophils Relative: 1 %
HCT: 44.9 % (ref 39.0–52.0)
Hemoglobin: 14.6 g/dL (ref 13.0–17.0)
Immature Granulocytes: 1 %
Lymphocytes Relative: 10 %
Lymphs Abs: 0.8 10*3/uL (ref 0.7–4.0)
MCH: 28 pg (ref 26.0–34.0)
MCHC: 32.5 g/dL (ref 30.0–36.0)
MCV: 86 fL (ref 80.0–100.0)
Monocytes Absolute: 0.3 10*3/uL (ref 0.1–1.0)
Monocytes Relative: 4 %
Neutro Abs: 6.6 10*3/uL (ref 1.7–7.7)
Neutrophils Relative %: 84 %
Platelets: 193 10*3/uL (ref 150–400)
RBC: 5.22 MIL/uL (ref 4.22–5.81)
RDW: 13.3 % (ref 11.5–15.5)
WBC: 7.8 10*3/uL (ref 4.0–10.5)
nRBC: 0 % (ref 0.0–0.2)

## 2019-04-28 IMAGING — CT CT ABD-PELV W/ CM
2 of 5 series · 14 of 46 positions shown, 16 images · IV contrast (APPLIED)
Comparison: None.

CLINICAL DATA: Fell from a ladder.  Right rib and back pain.

EXAM:
CT CHEST, ABDOMEN, AND PELVIS WITH CONTRAST
TECHNIQUE: Multidetector CT imaging of the chest, abdomen and pelvis was
performed following the standard protocol during bolus
administration of intravenous contrast.
CONTRAST:  100mL OMNIPAQUE IOHEXOL 300 MG/ML  SOLN

[Series 3: cap 5.0 i31f 2 · axial · 0.79mm/px · z∈[-632,-92]mm · 11 of 127 slices shown, 13 images]
[im 10/127  soft-tissue]
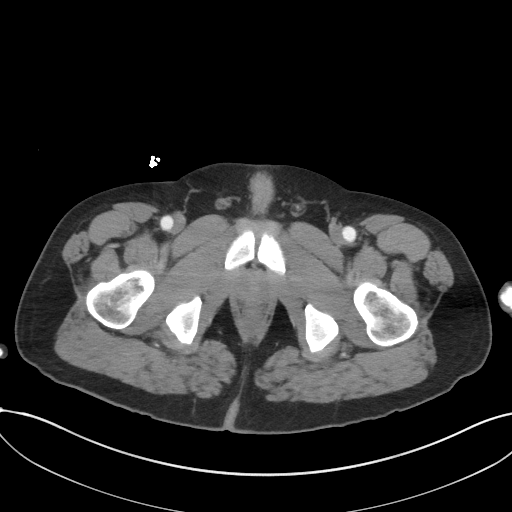
[im 10/127  bone]
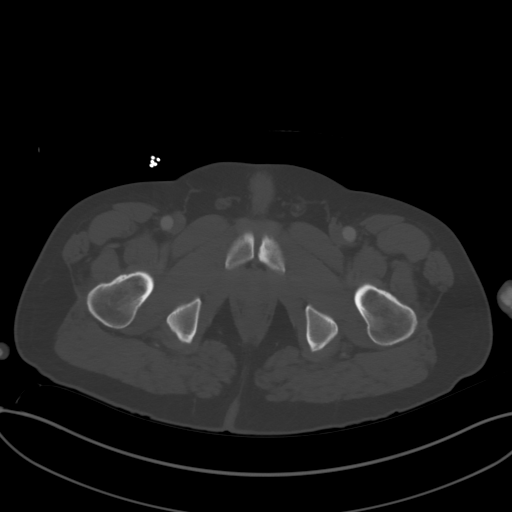
[im 19/127  soft-tissue]
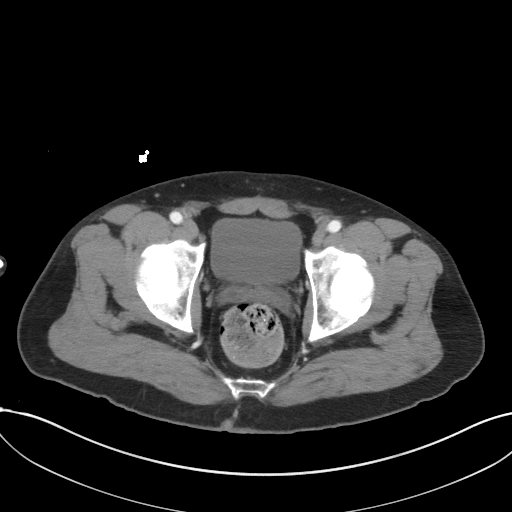
[im 28/127  soft-tissue]
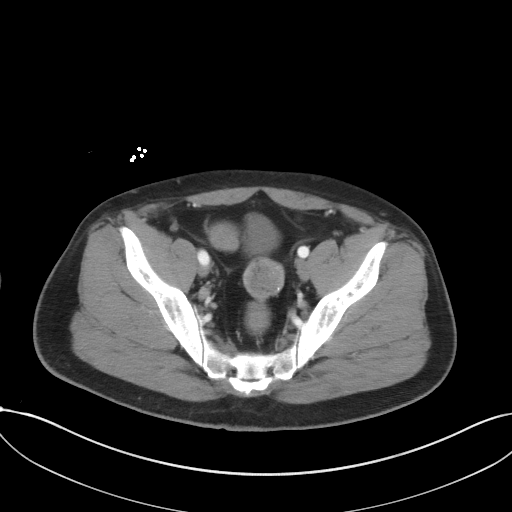
[im 46/127  soft-tissue]
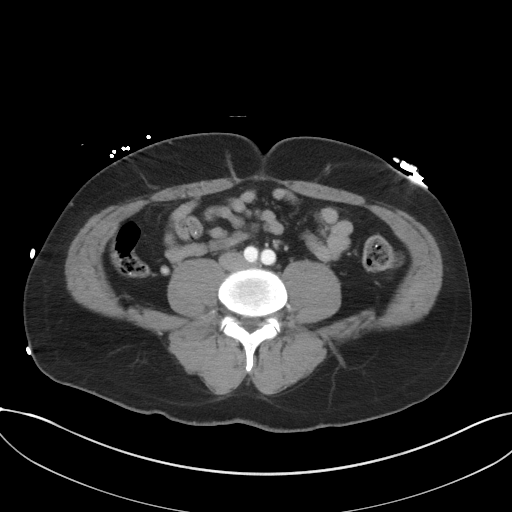
[im 55/127  soft-tissue]
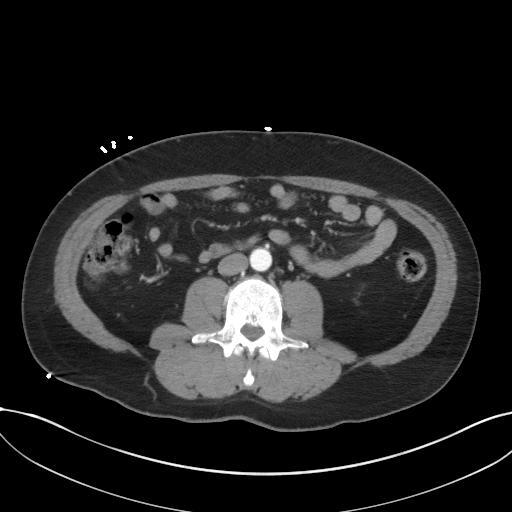
[im 64/127  soft-tissue]
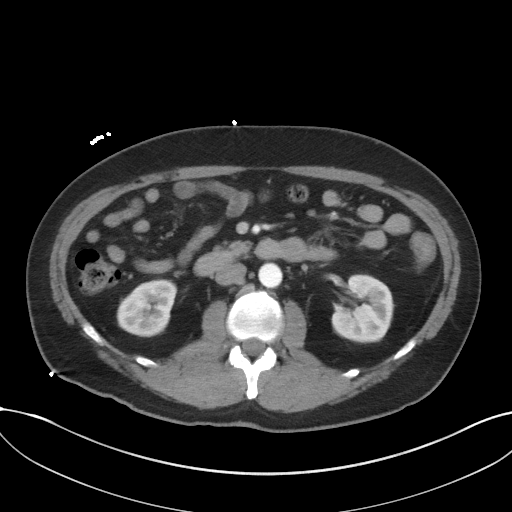
[im 73/127  soft-tissue]
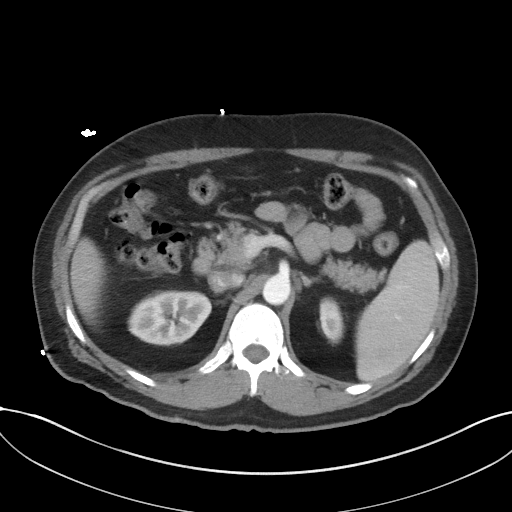
[im 82/127  soft-tissue]
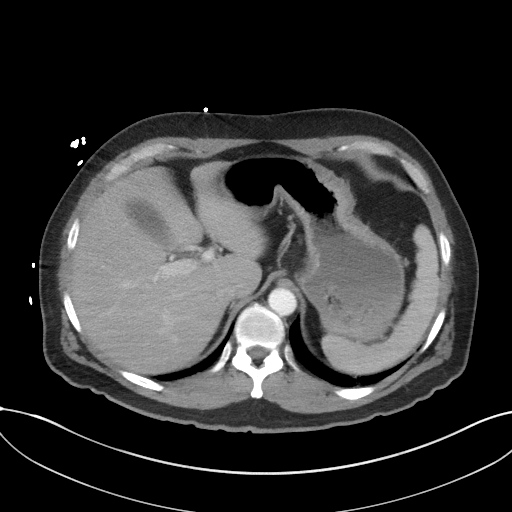
[im 100/127  soft-tissue]
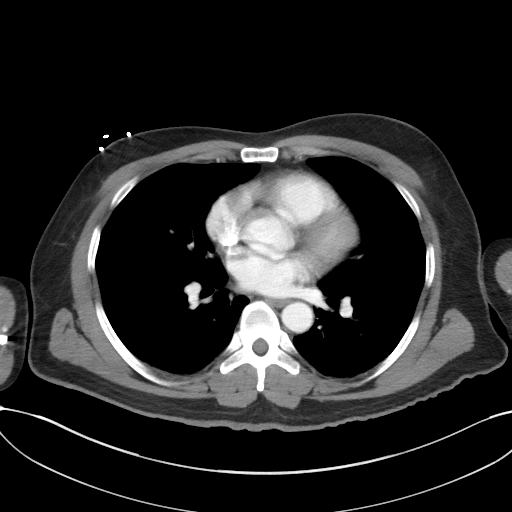
[im 100/127  bone]
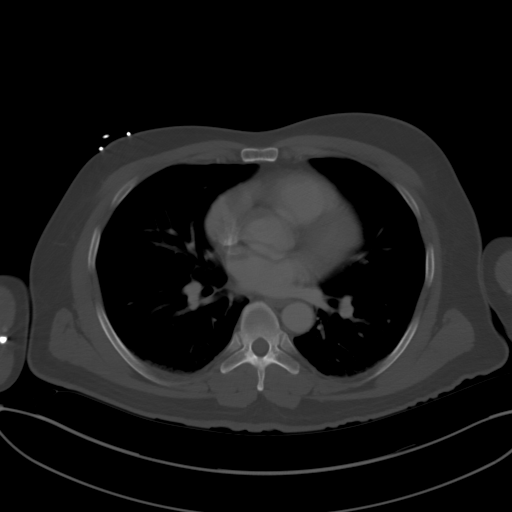
[im 109/127  soft-tissue]
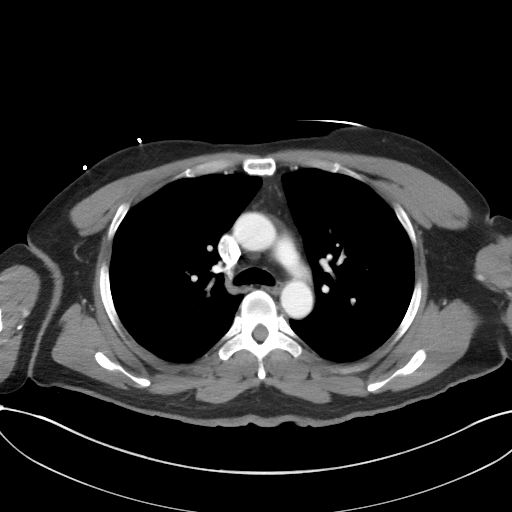
[im 118/127  soft-tissue]
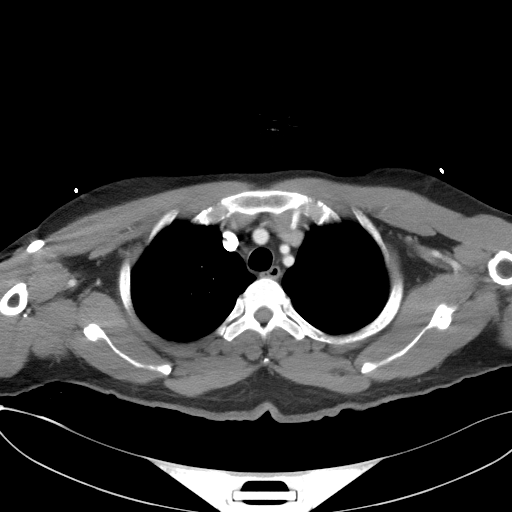

[Series 6: coronal · coronal · 0.79mm/px · 3 of 151 slices shown]
[im 51/151  soft-tissue]
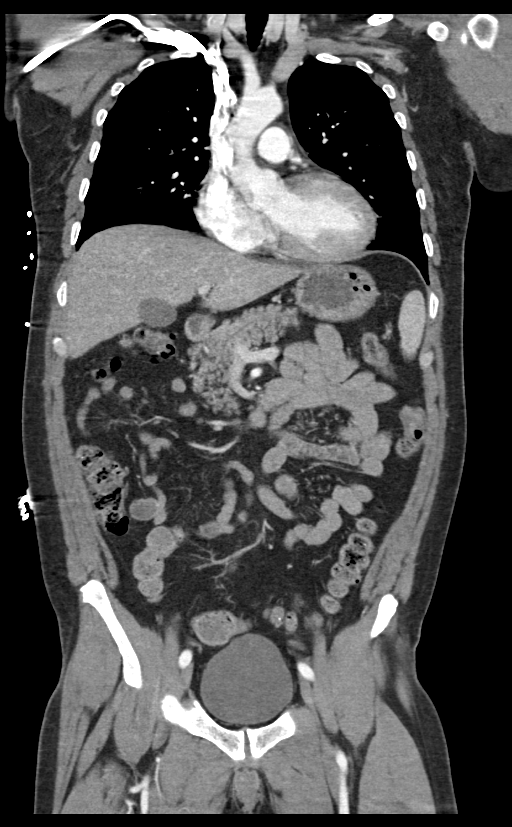
[im 67/151  soft-tissue]
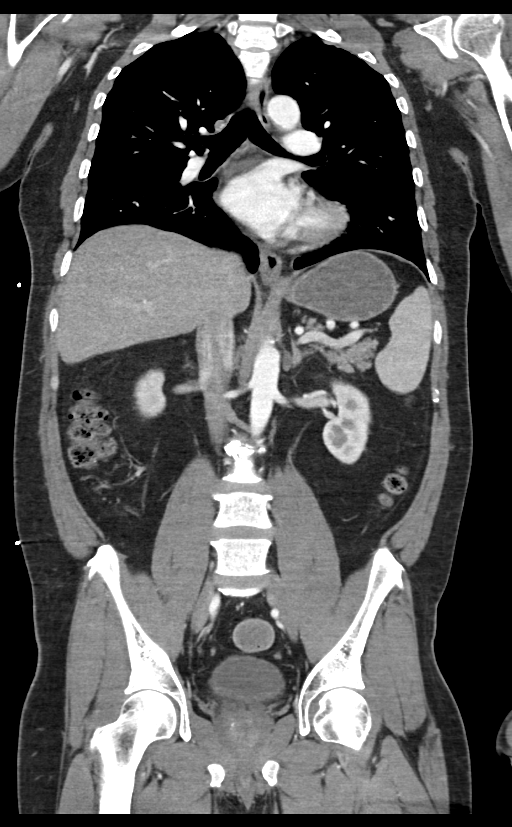
[im 84/151  soft-tissue]
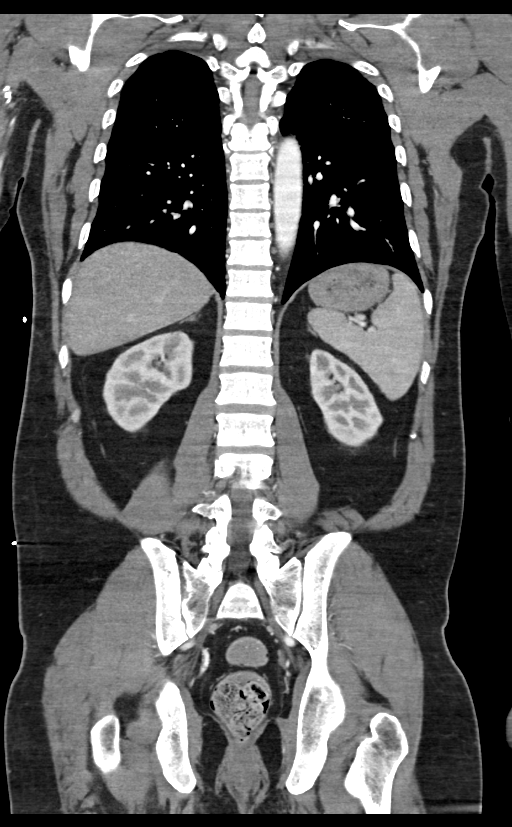

[14 of 46 positions shown; findings below may reference images not displayed]

FINDINGS: CT CHEST FINDINGS

Cardiovascular: The heart is normal in size. No pericardial
effusion. The aorta is normal in caliber. No dissection. No
atherosclerotic calcifications. The branch vessels are patent. No
coronary artery calcifications. The pulmonary arteries appear
normal.

Mediastinum/Nodes: No mediastinal or hilar mass or adenopathy or
hematoma. The esophagus is grossly normal.

Lungs/Pleura: No significant pulmonary findings. No pulmonary
contusion, pneumothorax or pleural effusion. No worrisome pulmonary
lesions.

Musculoskeletal: The sternum is intact. The thoracic vertebral
bodies are normally aligned. No thoracic spine compression
fractures. The facets are normally aligned. No facet fractures.

I do not see any definite acute right-sided rib fractures. The
sternoclavicular joints are intact. The clavicles appear intact.

CT ABDOMEN PELVIS FINDINGS

Hepatobiliary: No focal hepatic lesions or acute hepatic injury. No
perihepatic fluid collections. The gallbladder is normal. No common
bile duct dilatation.

Pancreas: No mass, inflammation or ductal dilatation. No acute
injury or peripancreatic fluid collection.

Spleen: Normal size. No acute injury. No perisplenic fluid
collection.

Adrenals/Urinary Tract: The adrenal glands and kidneys are
unremarkable. No acute renal injury or perinephric fluid collection.
The delayed images do not demonstrate any significant collecting
system abnormality or urine leak.

The bladder is unremarkable.

Stomach/Bowel: The stomach, duodenum, small bowel and colon are
unremarkable. No acute inflammatory process, mass lesions or
obstructive findings. The terminal ileum is normal. The appendix is
normal.

Vascular/Lymphatic: The aorta is normal in caliber. No dissection.
The branch vessels are patent. The major venous structures are
patent. No mesenteric or retroperitoneal mass or adenopathy. Small
scattered lymph nodes are noted.

Reproductive: The prostate gland and seminal vesicles are
unremarkable.

Other: No pelvic mass or adenopathy. No free pelvic fluid
collections. No inguinal mass or adenopathy. No abdominal wall
hernia or subcutaneous lesions.

Musculoskeletal: First, second and third right transverse process
fractures of the lumbar spine and small amount of hematoma in the
right paraspinal muscles. No vertebral body fractures. The facets
are normally aligned. No facet fractures.

The bony pelvis is intact. No pelvic fractures. The pubic symphysis
and SI joints are intact. Both hips are normally located.
IMPRESSION: 1. Right first, second and third transverse process fractures of the
lumbar spine. No vertebral body fractures.
2. No definite right-sided rib fractures.
3. Normal appearance of the heart and great vessels.
4. No acute pulmonary findings.
5. No acute findings in the abdomen/pelvis. The solid abdominal
organs are intact and there is no findings suspicious for a bowel
injury.
6. Intact bony pelvis.

## 2019-04-28 IMAGING — CT CT HEAD W/O CM
3 series · 15 of 47 positions shown, 18 images · non-contrast
Comparison: None.

CLINICAL DATA: Fall from ladder

EXAM:
CT HEAD WITHOUT CONTRAST
CT CERVICAL SPINE WITHOUT CONTRAST
TECHNIQUE: Multidetector CT imaging of the head and cervical spine was
performed following the standard protocol without intravenous
contrast. Multiplanar CT image reconstructions of the cervical spine
were also generated.

[Series 3: head 5.0 h30s · axial · 0.52mm/px · z∈[-100,+35]mm · 9 of 33 slices shown, 12 images]
[im 3/33  brain]
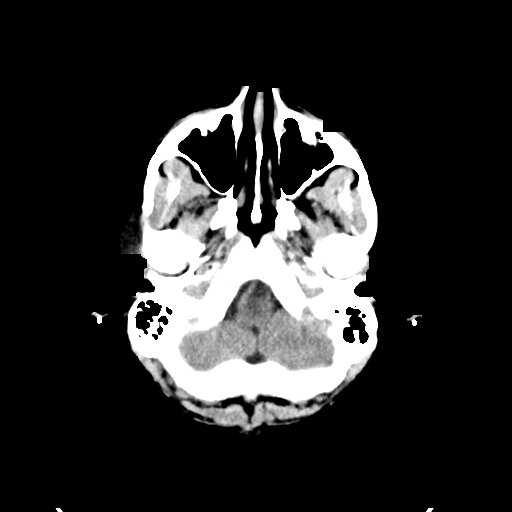
[im 3/33  bone]
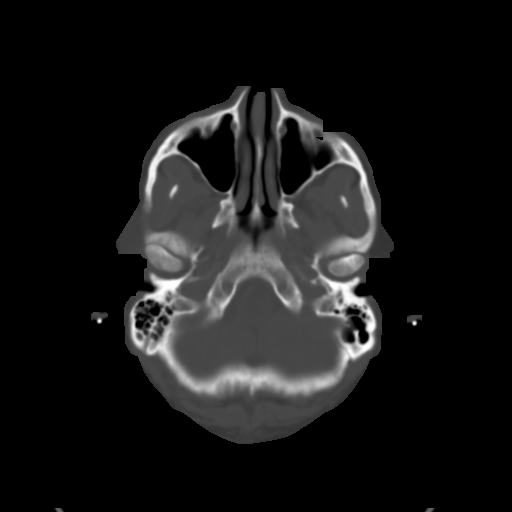
[im 6/33  brain]
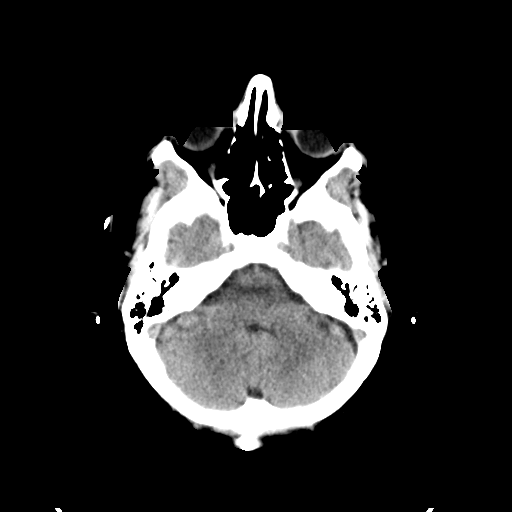
[im 9/33  brain]
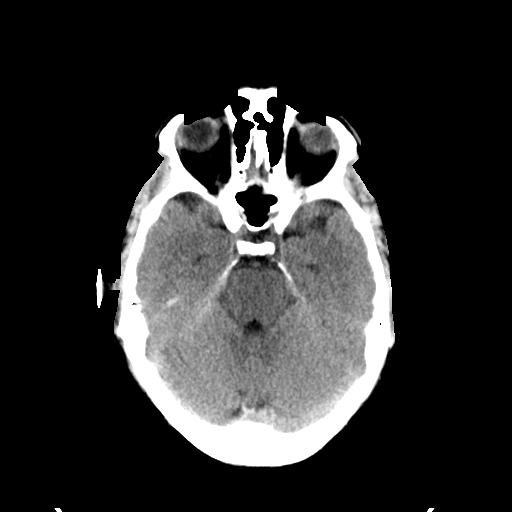
[im 13/33  brain]
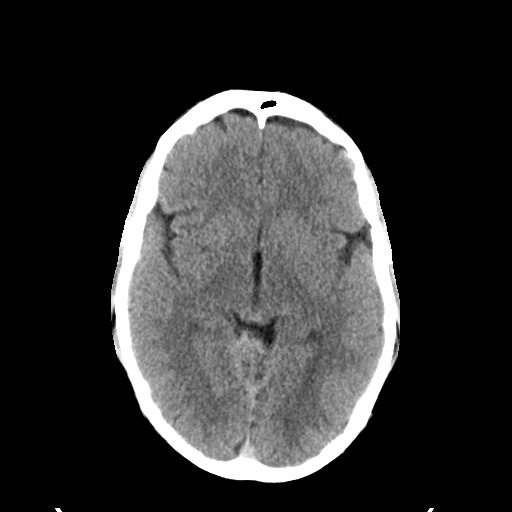
[im 17/33  brain]
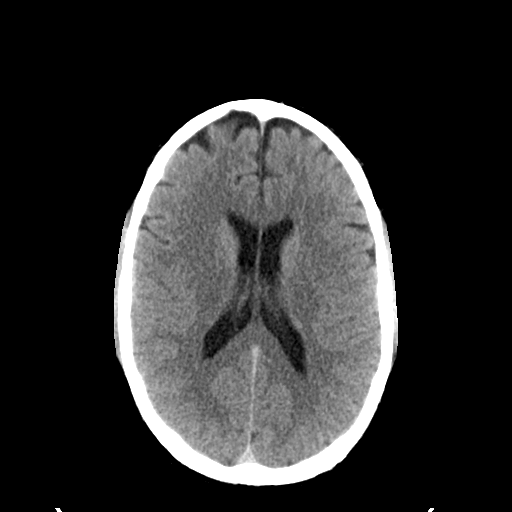
[im 17/33  bone]
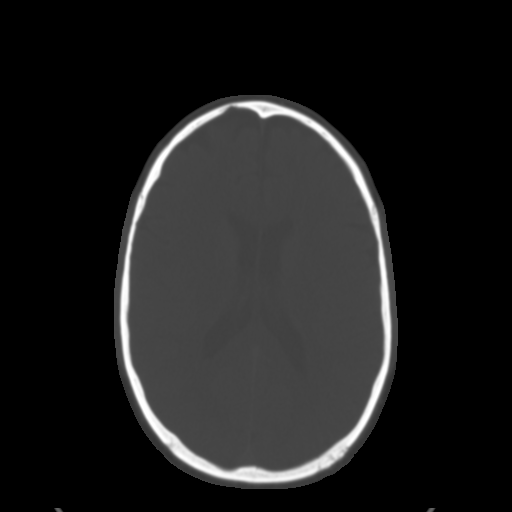
[im 20/33  brain]
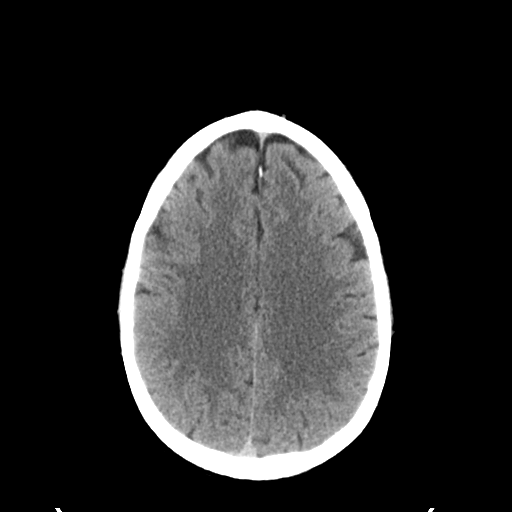
[im 24/33  brain]
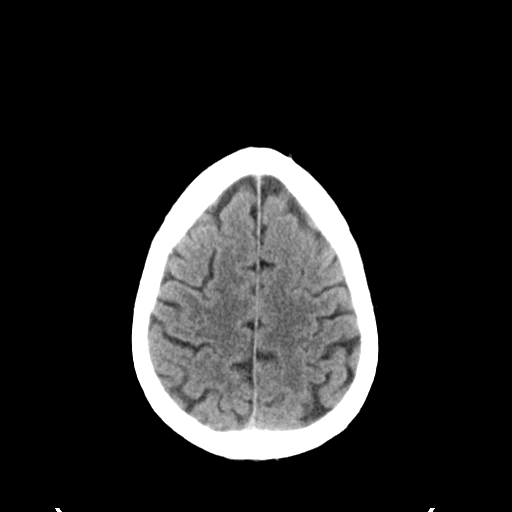
[im 27/33  brain]
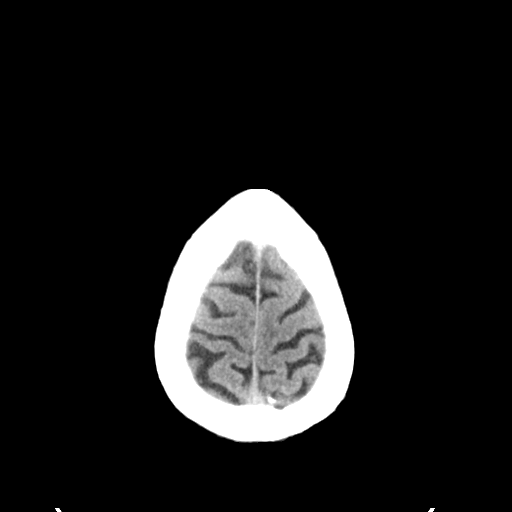
[im 30/33  brain]
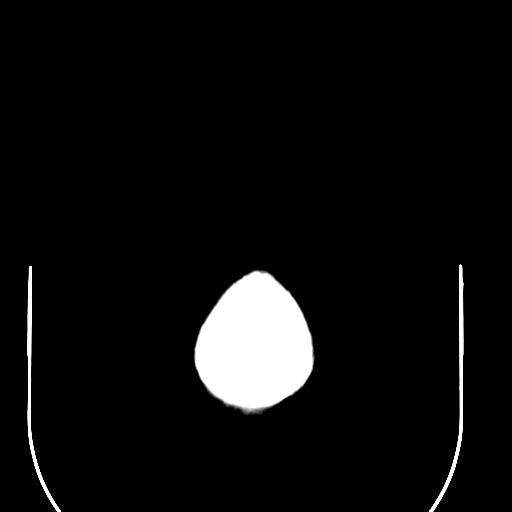
[im 30/33  bone]
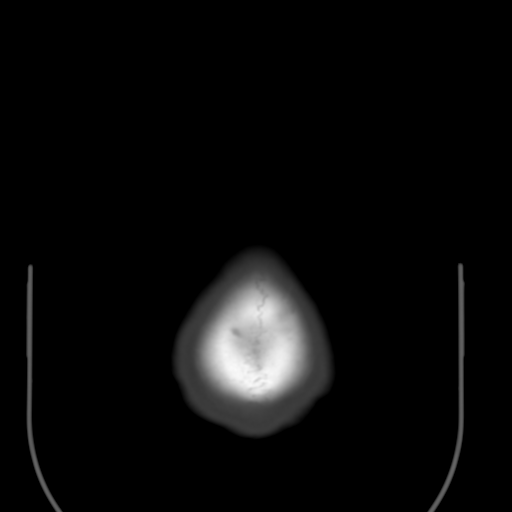

[Series 5: head 3.0 mpr cor · coronal · 0.33mm/px · 3 of 79 slices shown]
[im 27/79  brain]
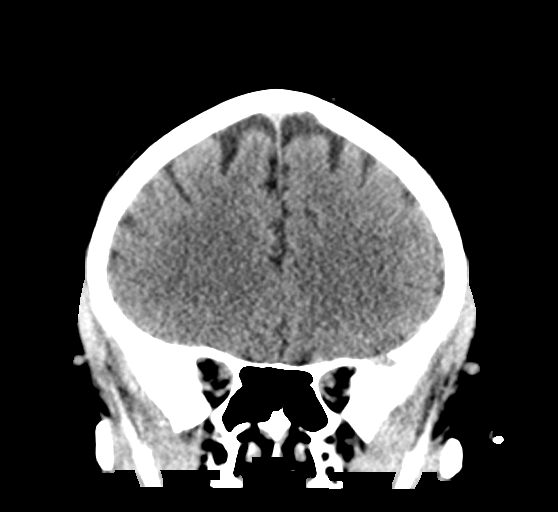
[im 35/79  brain]
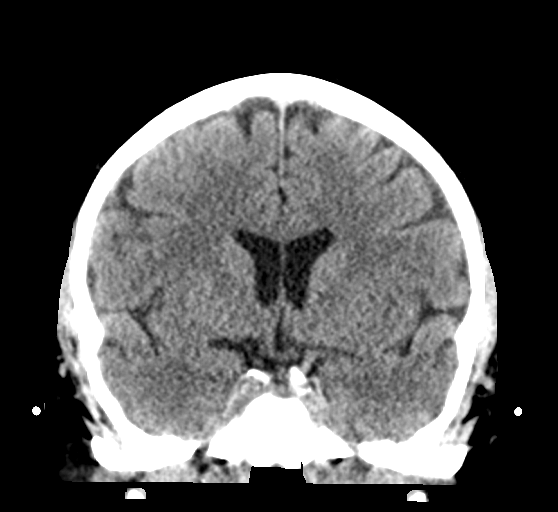
[im 44/79  brain]
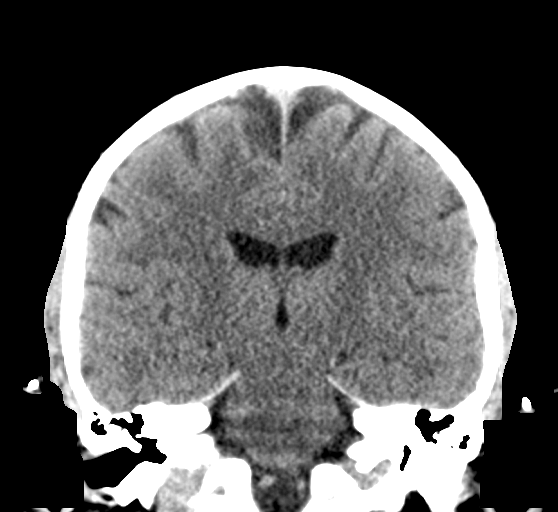

[Series 6: head 3.0 mpr sag · sagittal · 0.37mm/px · 3 of 66 slices shown]
[im 22/66  brain]
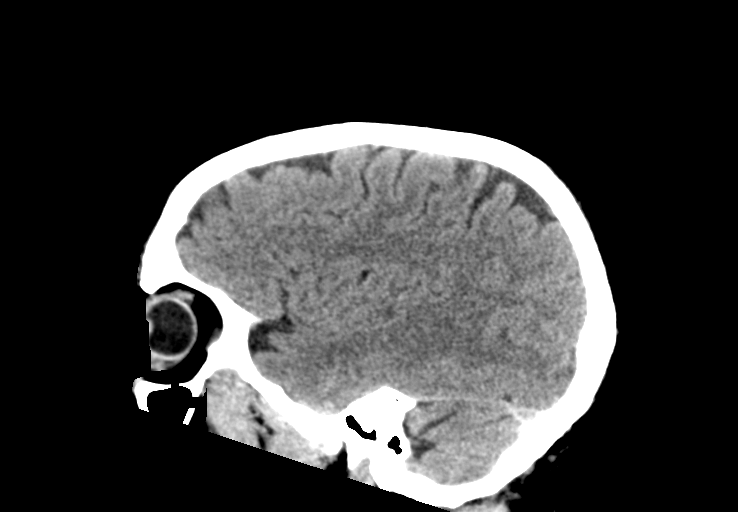
[im 33/66  brain]
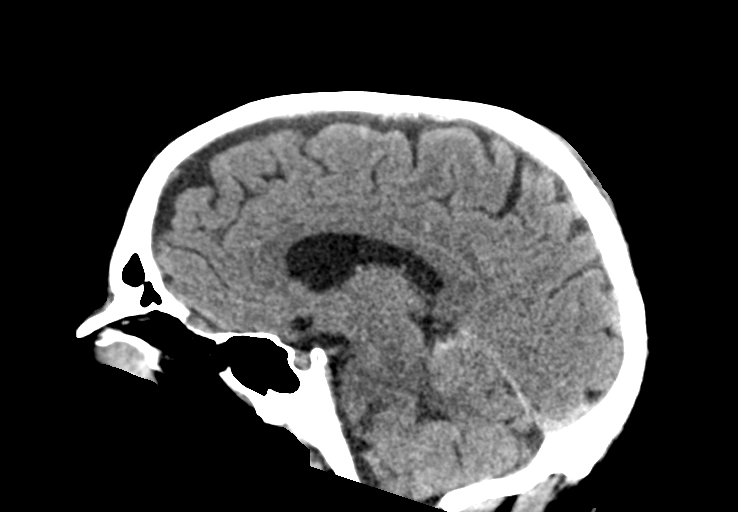
[im 44/66  brain]
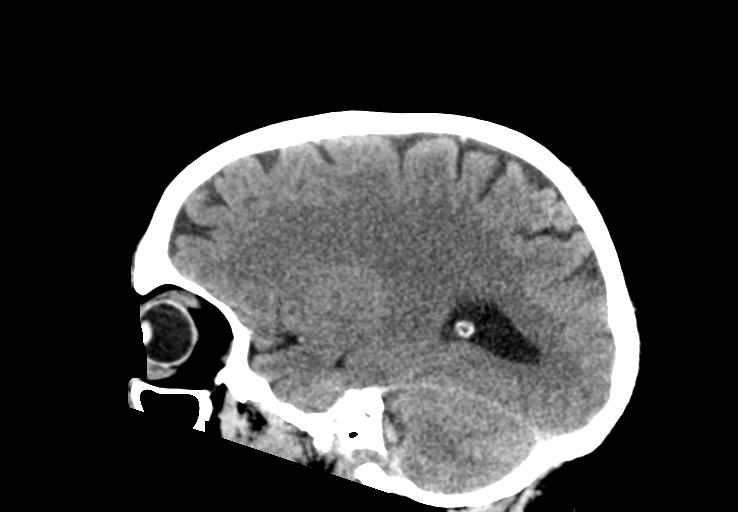

[15 of 47 positions shown; findings below may reference images not displayed]

FINDINGS: CT HEAD FINDINGS

Brain: No evidence of acute infarction, hemorrhage, hydrocephalus,
extra-axial collection or mass lesion/mass effect.

Vascular: No hyperdense vessel or unexpected calcification.

Skull: Normal. Negative for fracture or focal lesion.

Sinuses/Orbits: No acute finding.

Other: None.

CT CERVICAL SPINE FINDINGS

Alignment: Normal.

Skull base and vertebrae: No acute fracture. No primary bone lesion
or focal pathologic process.

Soft tissues and spinal canal: No prevertebral fluid or swelling. No
visible canal hematoma.

Disc levels: Mild multilevel disc space height loss and
osteophytosis.

Upper chest: Negative.

Other: None.
IMPRESSION: 1.  No acute intracranial pathology.

2.  No fracture or static subluxation of the cervical spine.

## 2019-04-28 IMAGING — DX DG CHEST 1V PORT
1 series · 1 of 1 positions shown · non-contrast
Comparison: None.

CLINICAL DATA: Fall from a ladder about 8 feet today. Right rib
pain.

EXAM:
PORTABLE CHEST 1 VIEW

[chest ap]
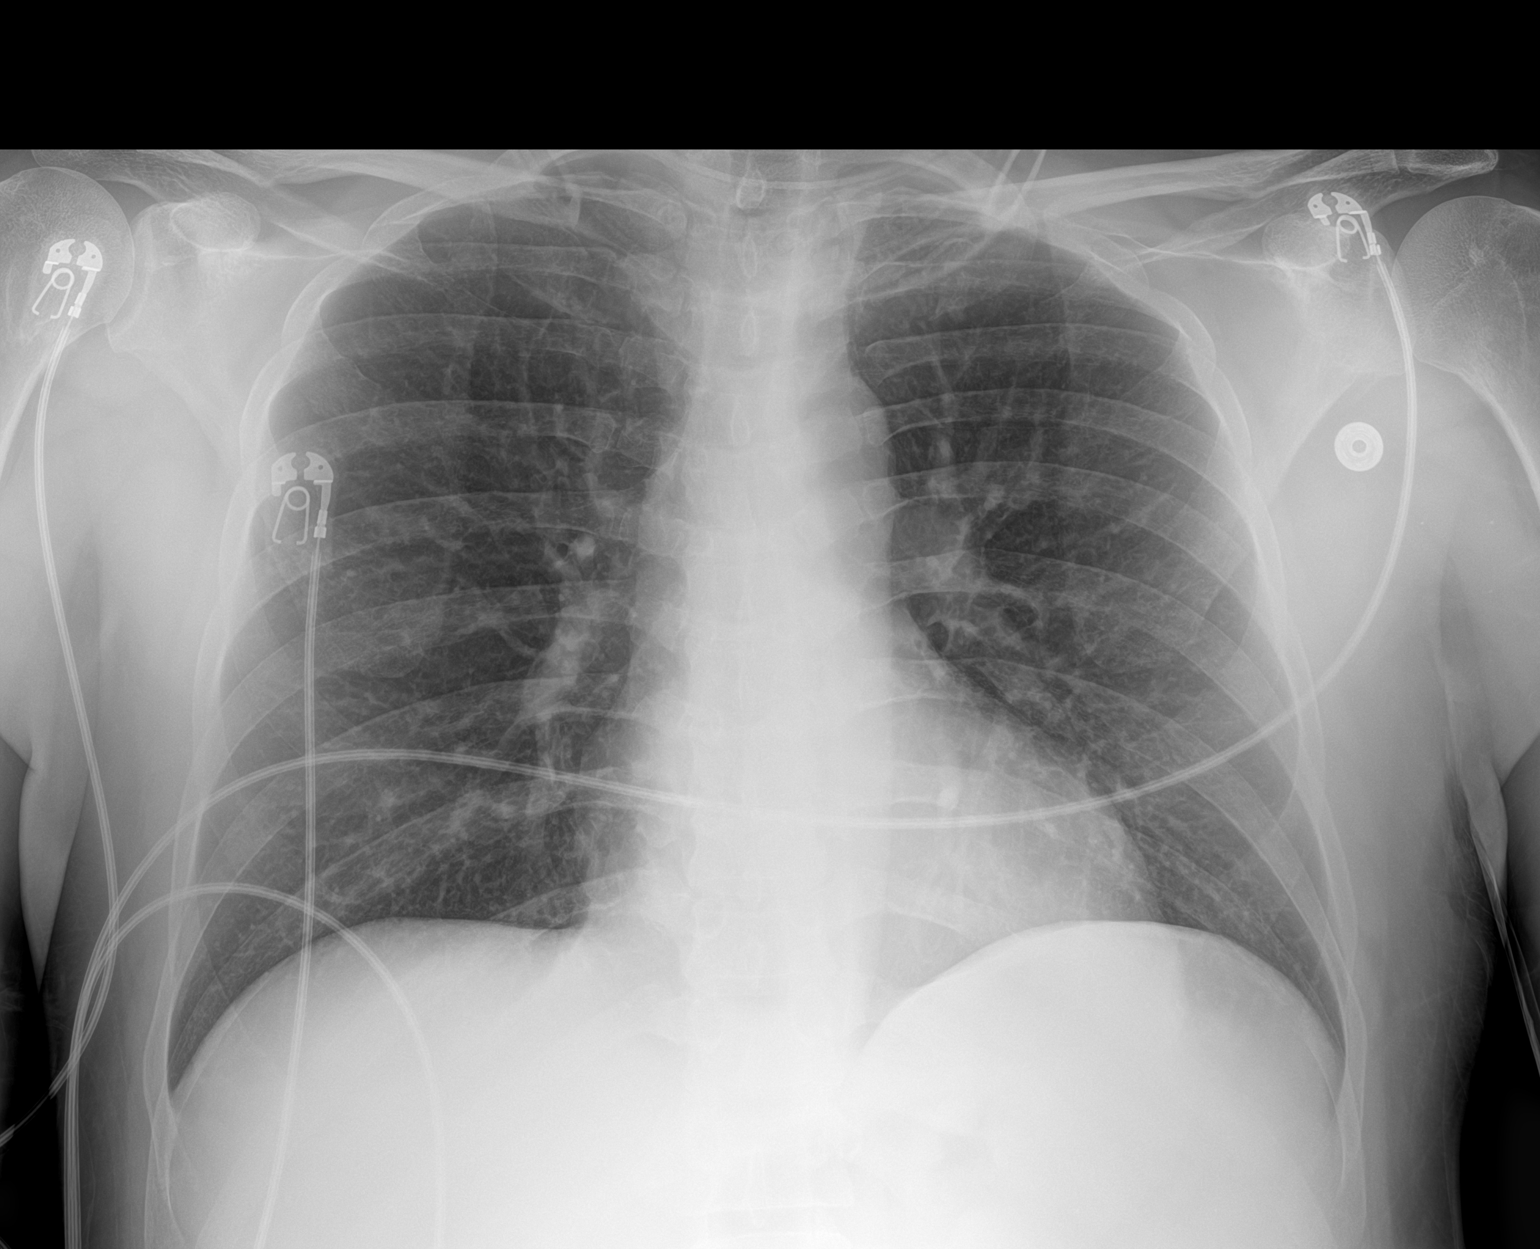

[1 of 1 positions shown; findings below may reference images not displayed]

FINDINGS: No pulmonary contusion or pneumothorax identified. No appreciable
displaced rib fracture. The lungs appear clear. Cardiac and
mediastinal margins appear normal.
IMPRESSION: No active disease.

## 2019-04-28 IMAGING — DX DG PORTABLE PELVIS
1 series · 1 of 1 positions shown · non-contrast
Comparison: None.

CLINICAL DATA: Fall 8 feet from a ladder today.  Right-sided pain.

EXAM:
PORTABLE PELVIS 1-2 VIEWS

[pelvis ap]
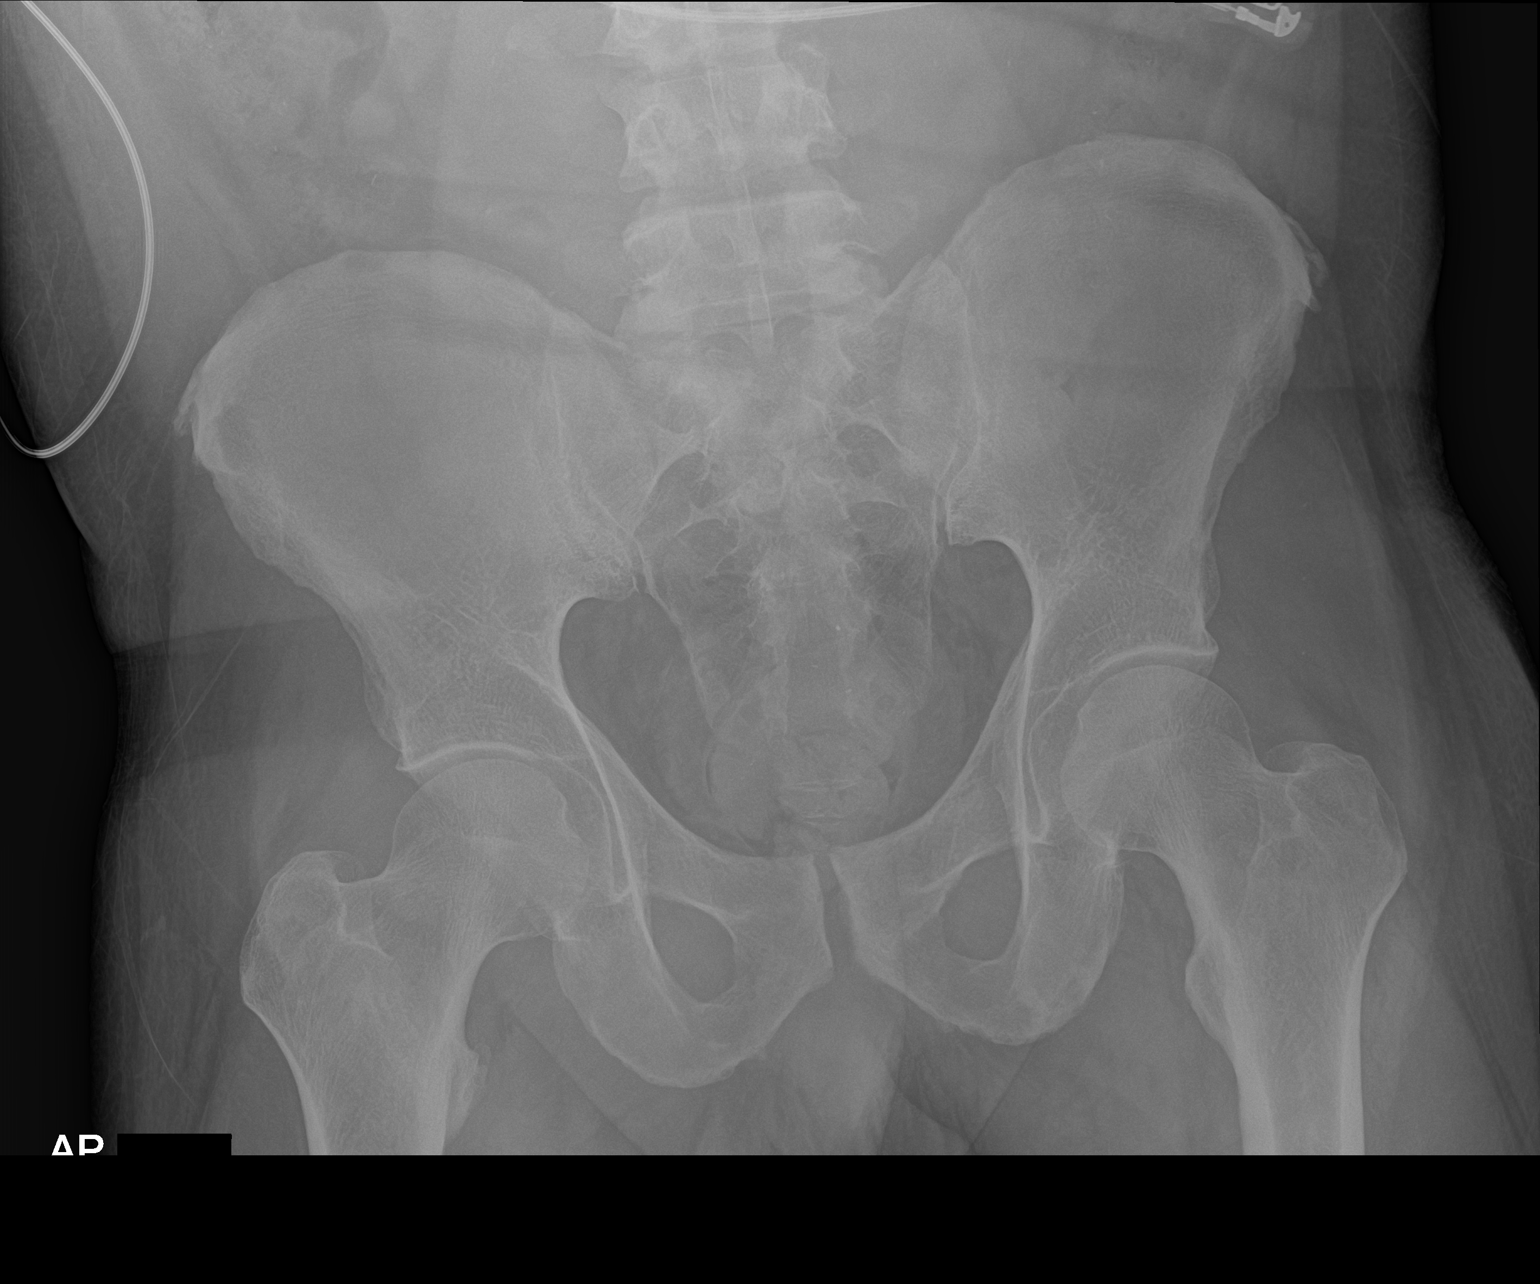

[1 of 1 positions shown; findings below may reference images not displayed]

FINDINGS: There is no evidence of pelvic fracture or diastasis. No pelvic bone
lesions are seen.
IMPRESSION: Negative.

## 2019-04-28 IMAGING — CT CT CERVICAL SPINE W/O CM
4 series · 14 of 33 positions shown, 17 images · non-contrast
Comparison: None.

CLINICAL DATA: Fall from ladder

EXAM:
CT HEAD WITHOUT CONTRAST
CT CERVICAL SPINE WITHOUT CONTRAST
TECHNIQUE: Multidetector CT imaging of the head and cervical spine was
performed following the standard protocol without intravenous
contrast. Multiplanar CT image reconstructions of the cervical spine
were also generated.

[Series 5: c_spine 2.0 st · axial · 0.24mm/px · z∈[-287,-145]mm · 5 of 107 slices shown, 7 images]
[im 18/107  soft-tissue]
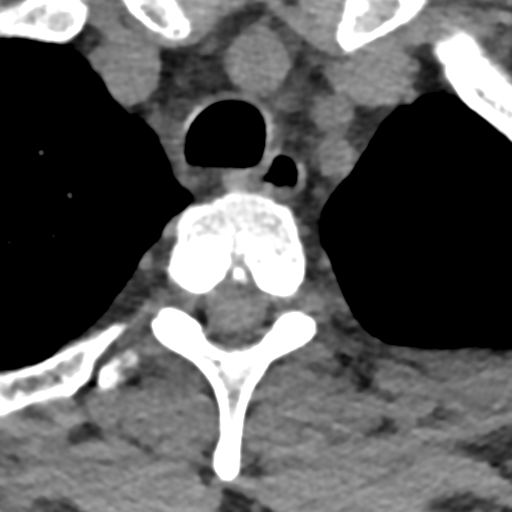
[im 18/107  bone]
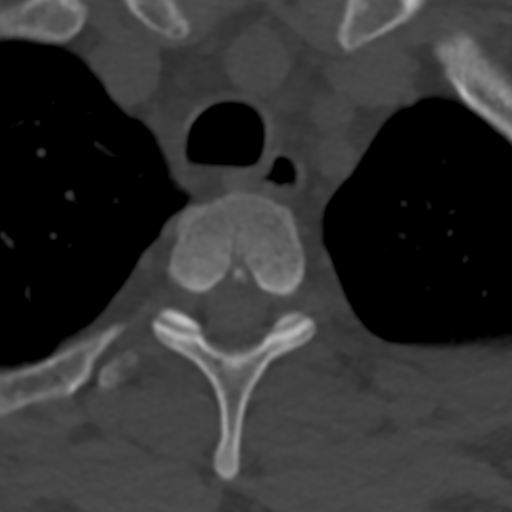
[im 36/107  bone]
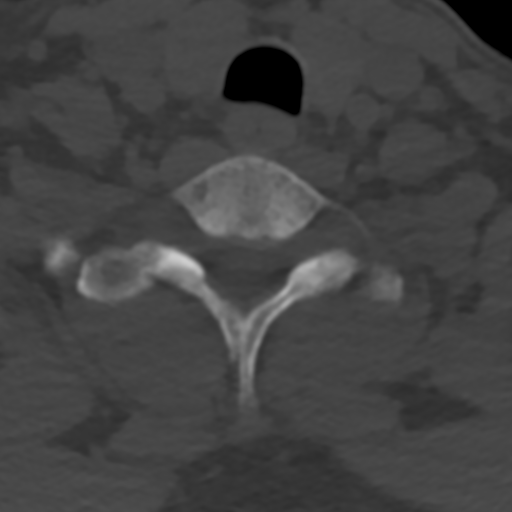
[im 54/107  bone]
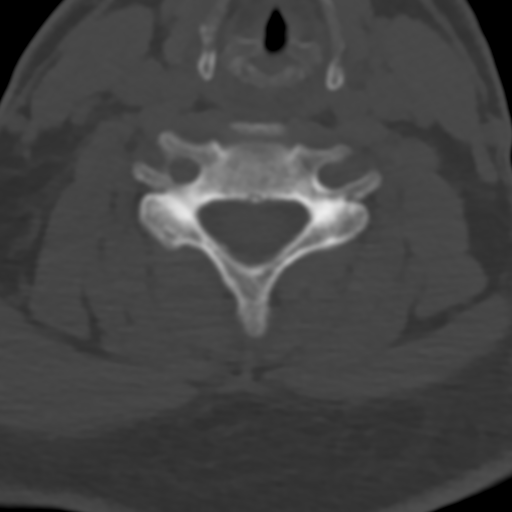
[im 71/107  bone]
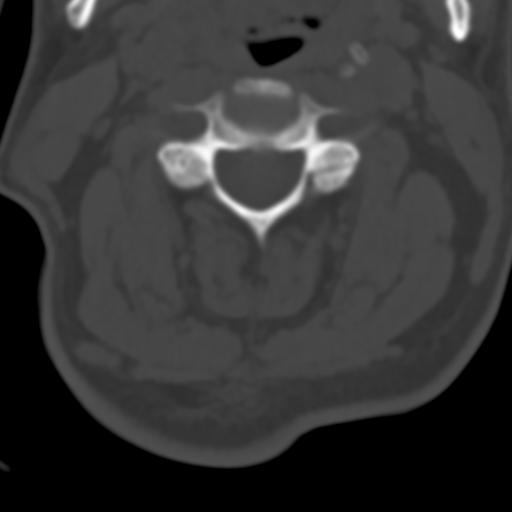
[im 89/107  soft-tissue]
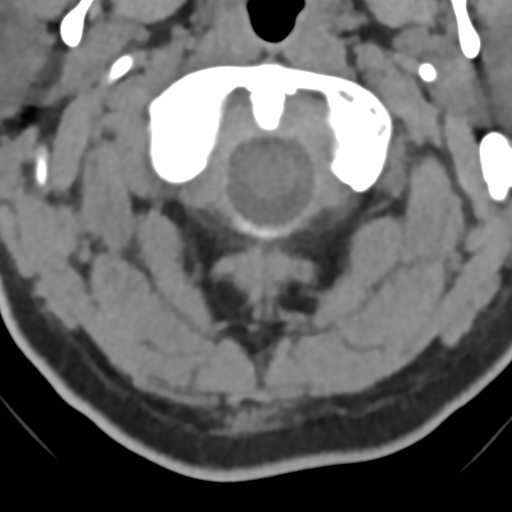
[im 89/107  bone]
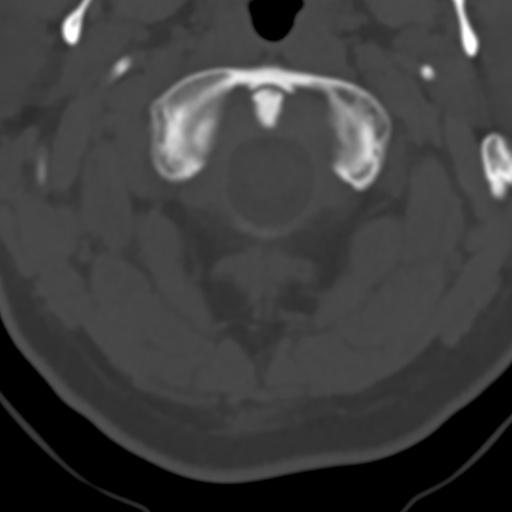

[Series 8: coronal bone · coronal · 0.31mm/px · 3 of 61 slices shown]
[im 13/61  bone]
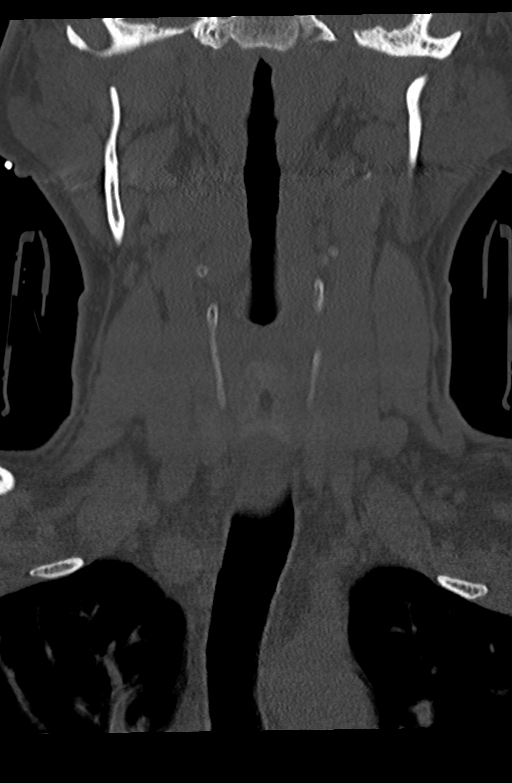
[im 25/61  bone]
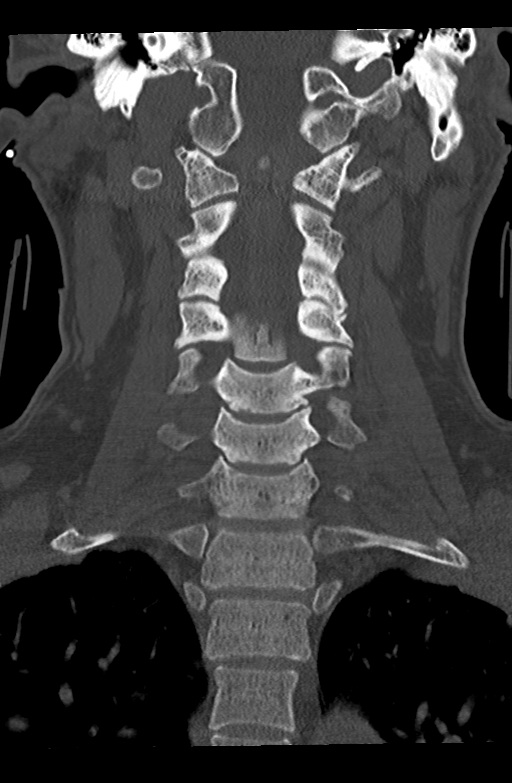
[im 37/61  bone]
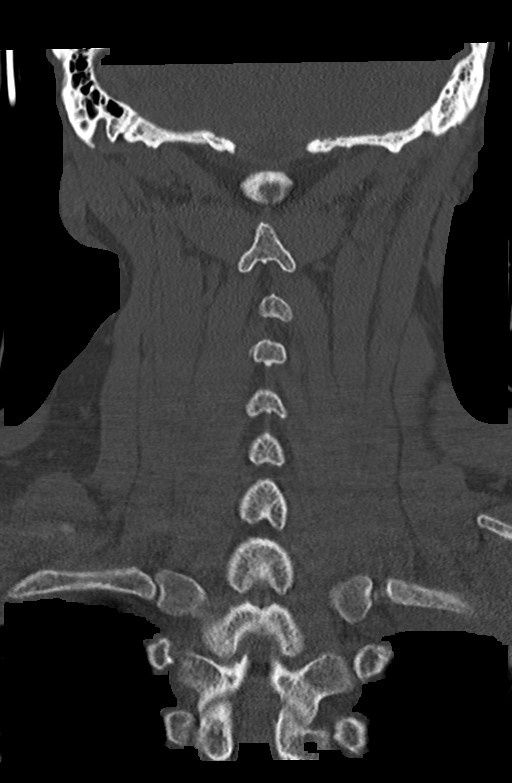

[Series 9: sagittal bone · sagittal · 0.41mm/px · 5 of 61 slices shown, 6 images]
[im 21/61  bone]
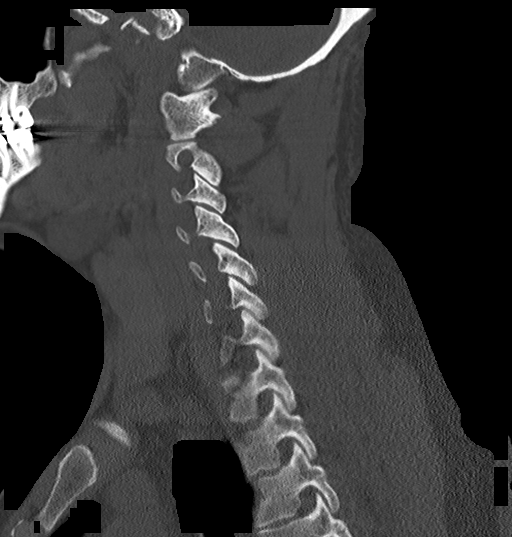
[im 26/61  bone]
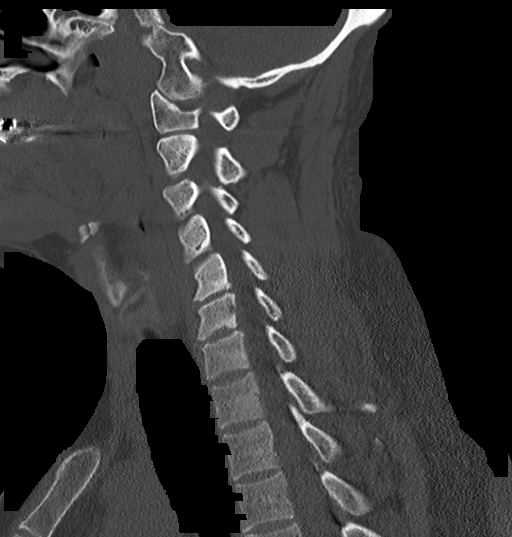
[im 31/61  soft-tissue]
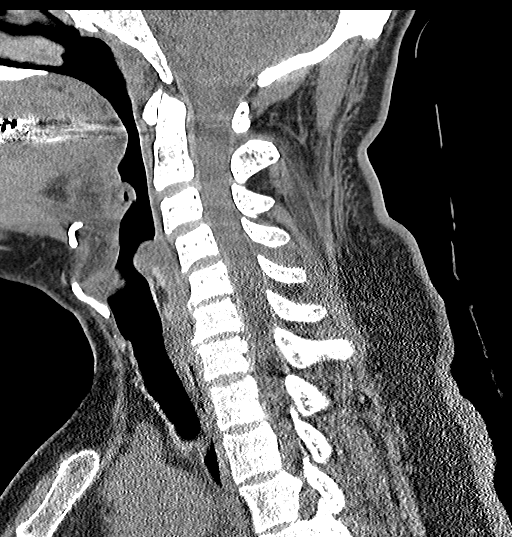
[im 31/61  bone]
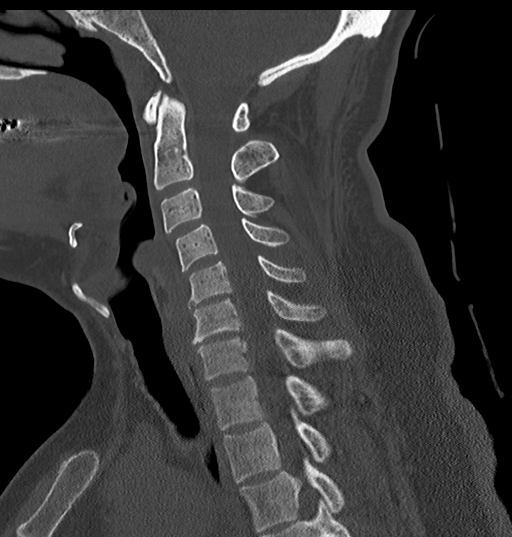
[im 36/61  bone]
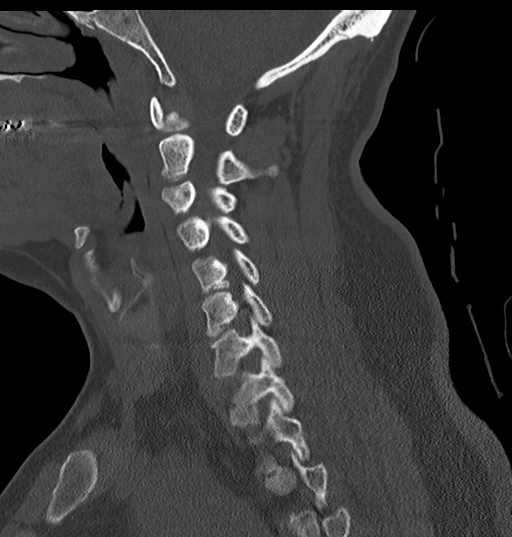
[im 41/61  bone]
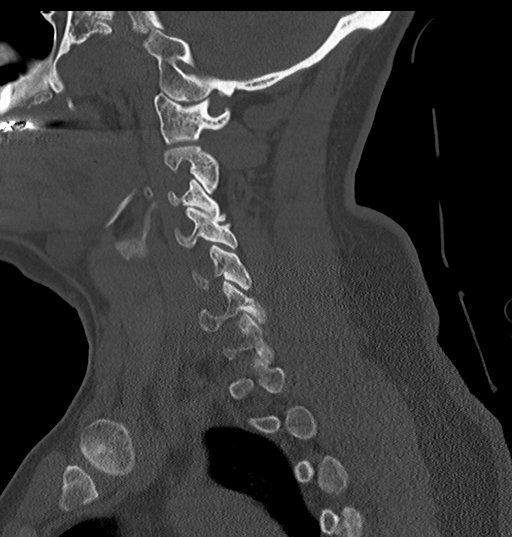

[Series 11: orthogonal axial st · axial · 0.21mm/px · 1 of 100 slices shown]
[im 17/100  bone]
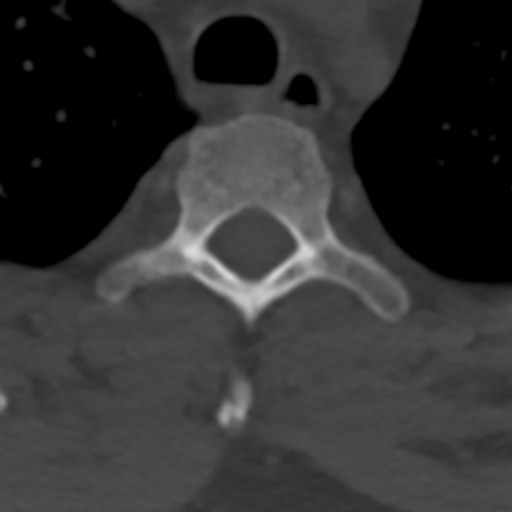

[14 of 33 positions shown; findings below may reference images not displayed]

FINDINGS: CT HEAD FINDINGS

Brain: No evidence of acute infarction, hemorrhage, hydrocephalus,
extra-axial collection or mass lesion/mass effect.

Vascular: No hyperdense vessel or unexpected calcification.

Skull: Normal. Negative for fracture or focal lesion.

Sinuses/Orbits: No acute finding.

Other: None.

CT CERVICAL SPINE FINDINGS

Alignment: Normal.

Skull base and vertebrae: No acute fracture. No primary bone lesion
or focal pathologic process.

Soft tissues and spinal canal: No prevertebral fluid or swelling. No
visible canal hematoma.

Disc levels: Mild multilevel disc space height loss and
osteophytosis.

Upper chest: Negative.

Other: None.
IMPRESSION: 1.  No acute intracranial pathology.

2.  No fracture or static subluxation of the cervical spine.

## 2019-04-28 IMAGING — CT CT CHEST W/ CM
2 of 5 series · 13 of 36 positions shown, 16 images · IV contrast (APPLIED)
Comparison: None.

CLINICAL DATA: Fell from a ladder.  Right rib and back pain.

EXAM:
CT CHEST, ABDOMEN, AND PELVIS WITH CONTRAST
TECHNIQUE: Multidetector CT imaging of the chest, abdomen and pelvis was
performed following the standard protocol during bolus
administration of intravenous contrast.
CONTRAST:  100mL OMNIPAQUE IOHEXOL 300 MG/ML  SOLN

[Series 3: cap 5.0 i31f 2 · axial · 0.79mm/px · z∈[-632,-97]mm · 10 of 127 slices shown, 13 images]
[im 10/127  mediastinal]
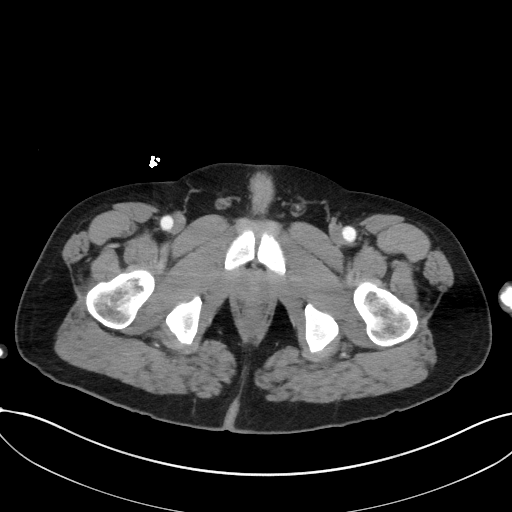
[im 10/127  lung]
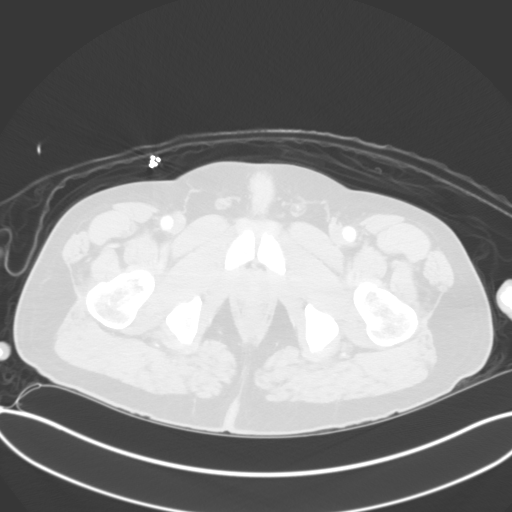
[im 20/127  lung]
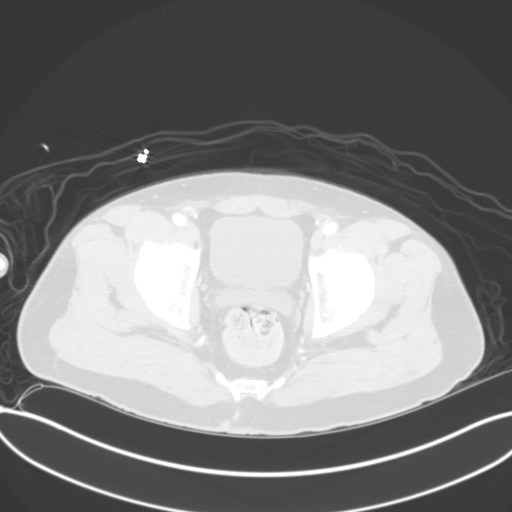
[im 39/127  lung]
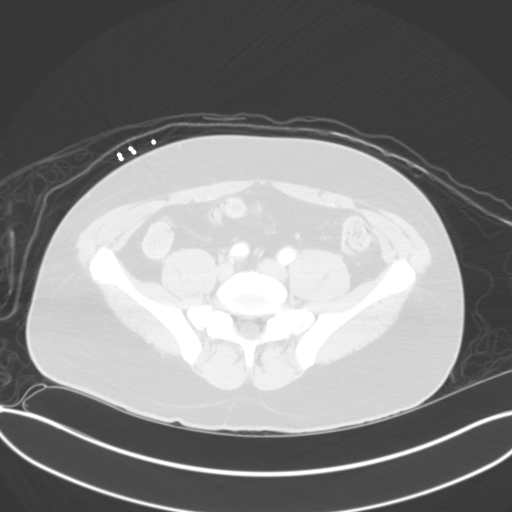
[im 49/127  lung]
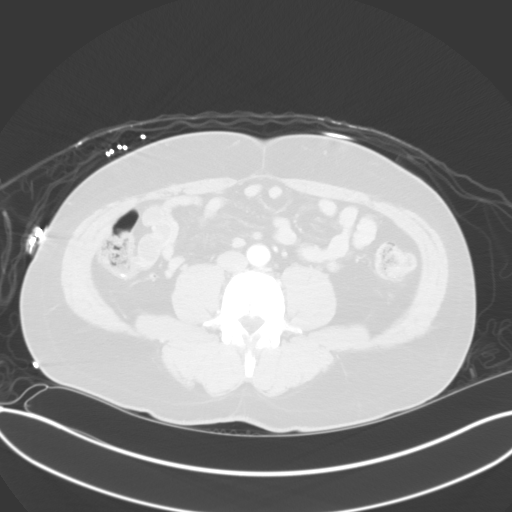
[im 59/127  mediastinal]
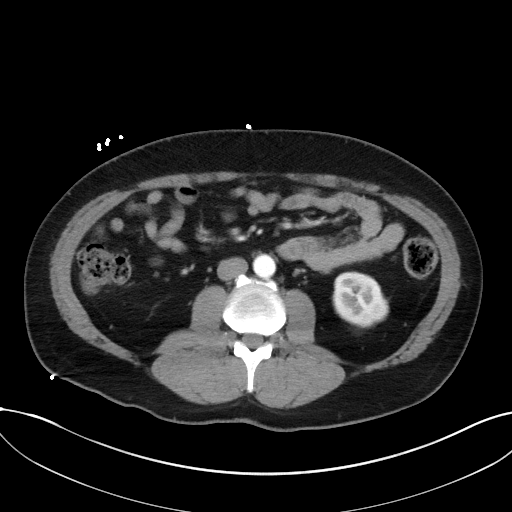
[im 59/127  lung]
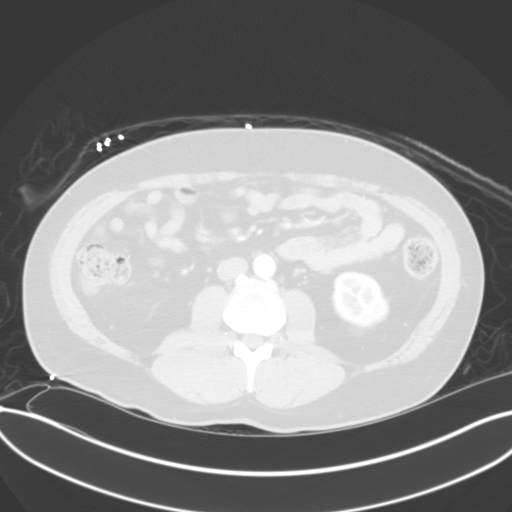
[im 68/127  lung]
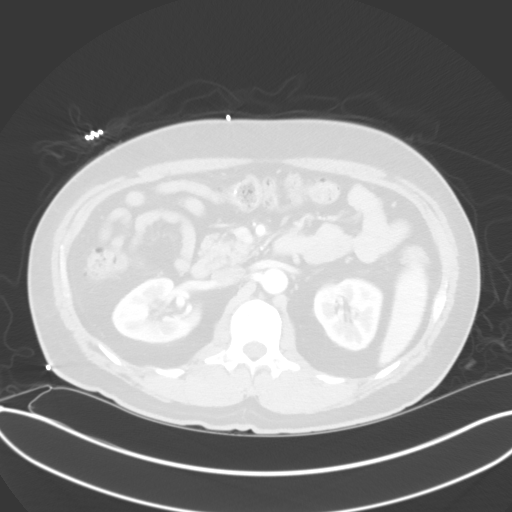
[im 78/127  lung]
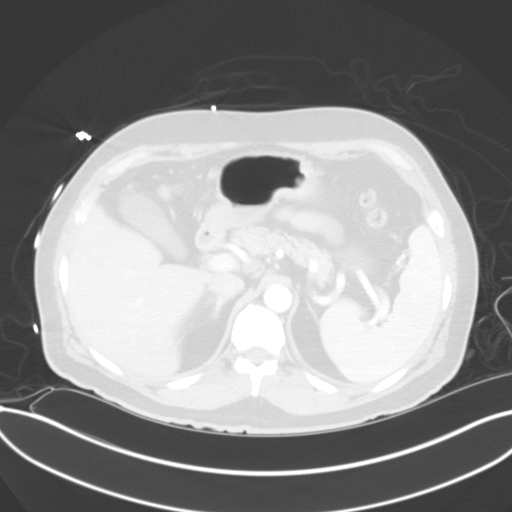
[im 97/127  lung]
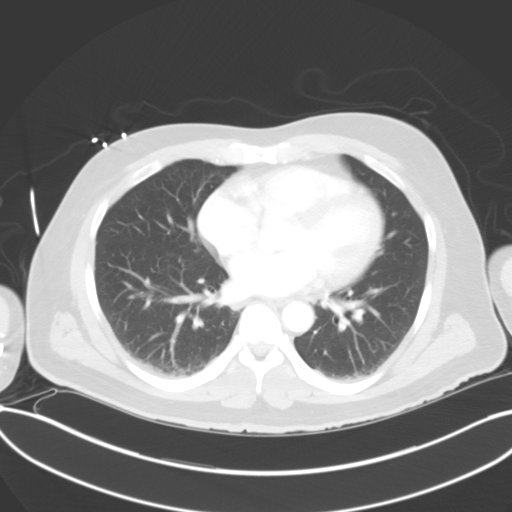
[im 107/127  mediastinal]
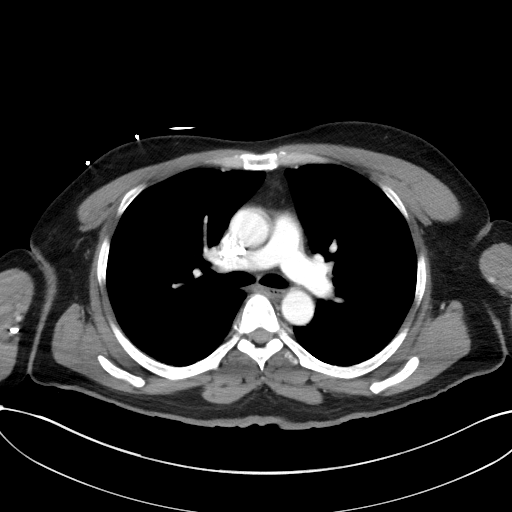
[im 107/127  lung]
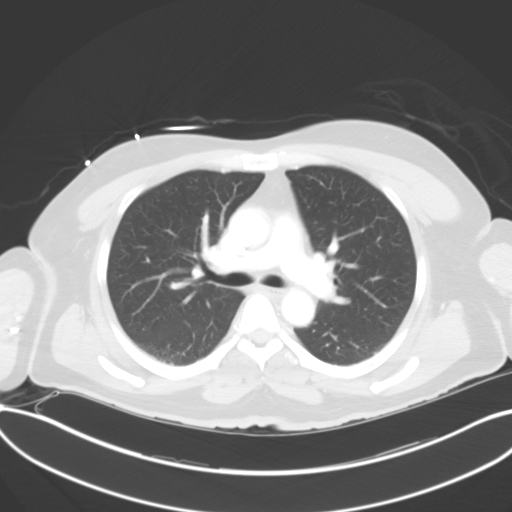
[im 117/127  lung]
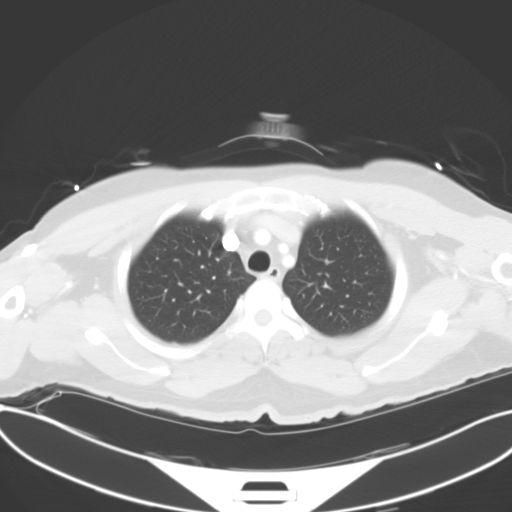

[Series 6: coronal · coronal · 0.79mm/px · 3 of 151 slices shown]
[im 31/151  lung]
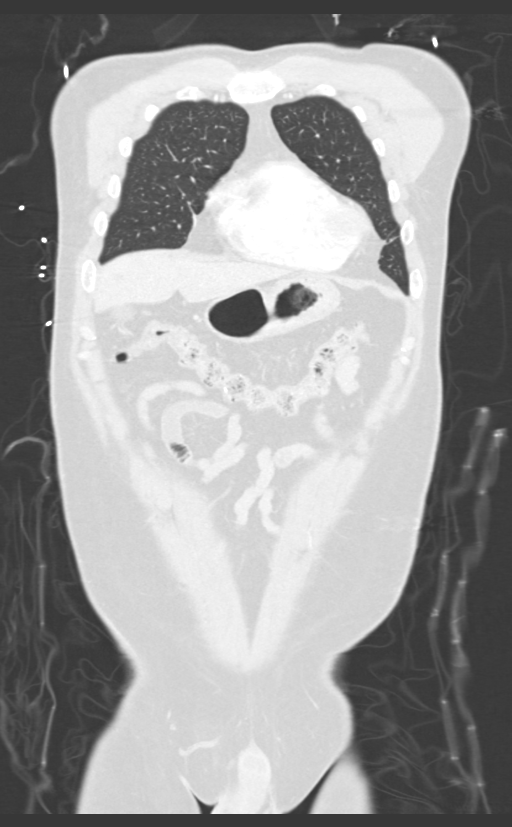
[im 61/151  lung]
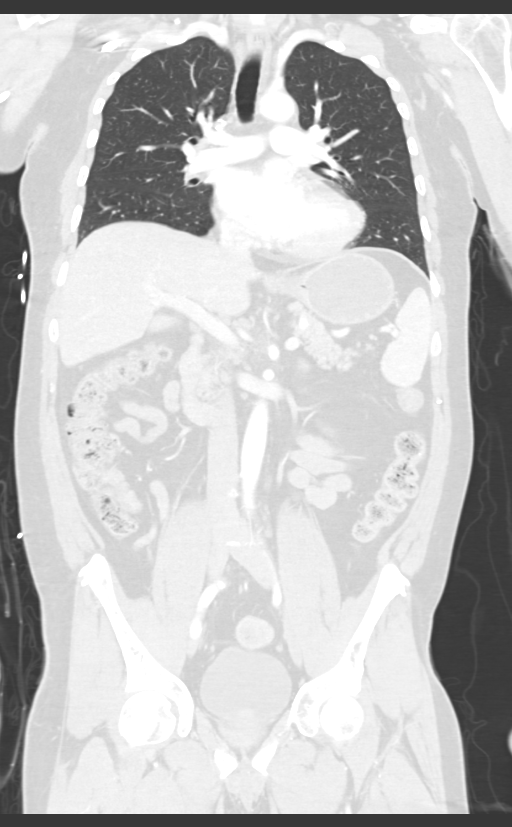
[im 91/151  lung]
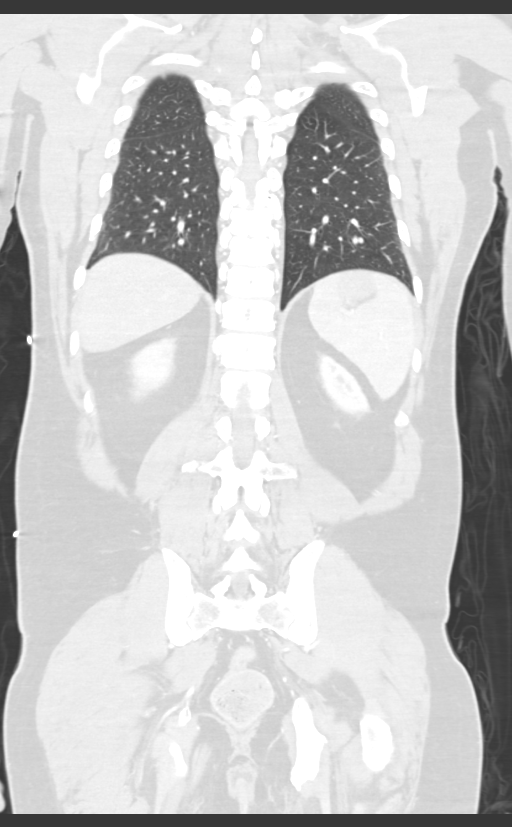

[13 of 36 positions shown; findings below may reference images not displayed]

FINDINGS: CT CHEST FINDINGS

Cardiovascular: The heart is normal in size. No pericardial
effusion. The aorta is normal in caliber. No dissection. No
atherosclerotic calcifications. The branch vessels are patent. No
coronary artery calcifications. The pulmonary arteries appear
normal.

Mediastinum/Nodes: No mediastinal or hilar mass or adenopathy or
hematoma. The esophagus is grossly normal.

Lungs/Pleura: No significant pulmonary findings. No pulmonary
contusion, pneumothorax or pleural effusion. No worrisome pulmonary
lesions.

Musculoskeletal: The sternum is intact. The thoracic vertebral
bodies are normally aligned. No thoracic spine compression
fractures. The facets are normally aligned. No facet fractures.

I do not see any definite acute right-sided rib fractures. The
sternoclavicular joints are intact. The clavicles appear intact.

CT ABDOMEN PELVIS FINDINGS

Hepatobiliary: No focal hepatic lesions or acute hepatic injury. No
perihepatic fluid collections. The gallbladder is normal. No common
bile duct dilatation.

Pancreas: No mass, inflammation or ductal dilatation. No acute
injury or peripancreatic fluid collection.

Spleen: Normal size. No acute injury. No perisplenic fluid
collection.

Adrenals/Urinary Tract: The adrenal glands and kidneys are
unremarkable. No acute renal injury or perinephric fluid collection.
The delayed images do not demonstrate any significant collecting
system abnormality or urine leak.

The bladder is unremarkable.

Stomach/Bowel: The stomach, duodenum, small bowel and colon are
unremarkable. No acute inflammatory process, mass lesions or
obstructive findings. The terminal ileum is normal. The appendix is
normal.

Vascular/Lymphatic: The aorta is normal in caliber. No dissection.
The branch vessels are patent. The major venous structures are
patent. No mesenteric or retroperitoneal mass or adenopathy. Small
scattered lymph nodes are noted.

Reproductive: The prostate gland and seminal vesicles are
unremarkable.

Other: No pelvic mass or adenopathy. No free pelvic fluid
collections. No inguinal mass or adenopathy. No abdominal wall
hernia or subcutaneous lesions.

Musculoskeletal: First, second and third right transverse process
fractures of the lumbar spine and small amount of hematoma in the
right paraspinal muscles. No vertebral body fractures. The facets
are normally aligned. No facet fractures.

The bony pelvis is intact. No pelvic fractures. The pubic symphysis
and SI joints are intact. Both hips are normally located.
IMPRESSION: 1. Right first, second and third transverse process fractures of the
lumbar spine. No vertebral body fractures.
2. No definite right-sided rib fractures.
3. Normal appearance of the heart and great vessels.
4. No acute pulmonary findings.
5. No acute findings in the abdomen/pelvis. The solid abdominal
organs are intact and there is no findings suspicious for a bowel
injury.
6. Intact bony pelvis.

## 2019-04-28 IMAGING — CT CT L SPINE W/O CM
3 of 4 series · 15 of 33 positions shown, 18 images · IV contrast (APPLIED)
Comparison: None.

CLINICAL DATA: Fell from a ladder.  Back pain.

EXAM:
CT LUMBAR SPINE WITHOUT CONTRAST
TECHNIQUE: Multidetector CT imaging of the lumbar spine was performed without
intravenous contrast administration. Multiplanar CT image
reconstructions were also generated.

[Series 12: lspine 1s s.t. · axial · 0.55mm/px · z∈[-517,-327]mm · 7 of 254 slices shown, 9 images]
[im 32/254  soft-tissue]
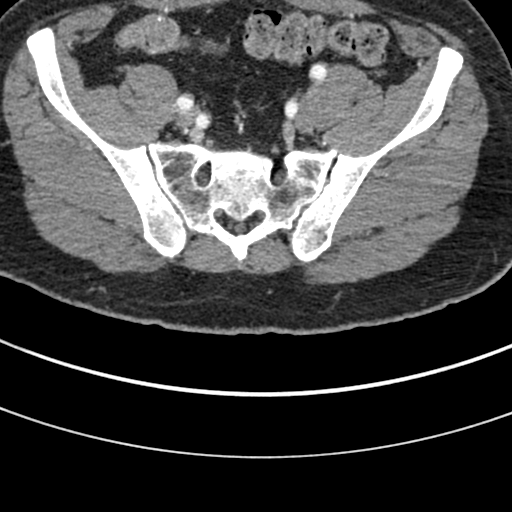
[im 32/254  bone]
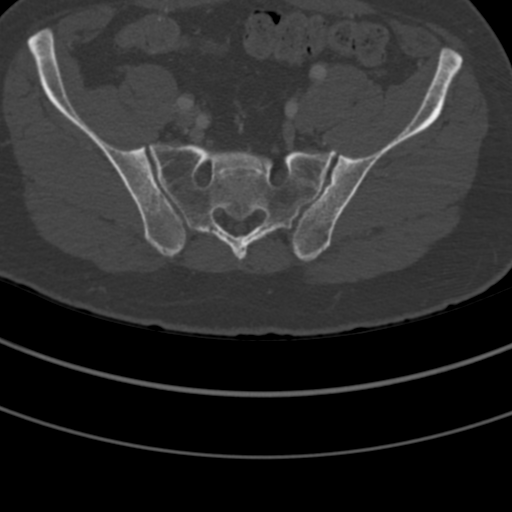
[im 64/254  bone]
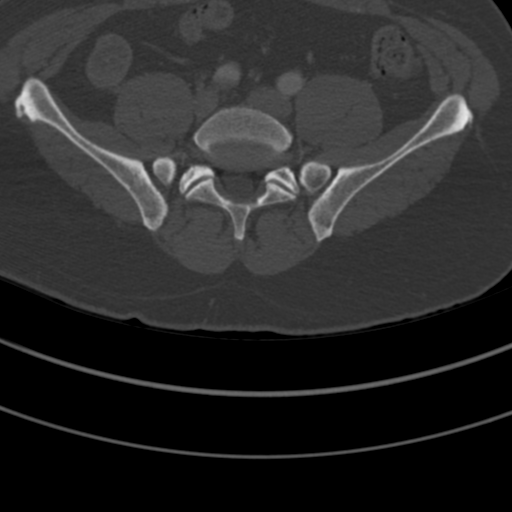
[im 95/254  bone]
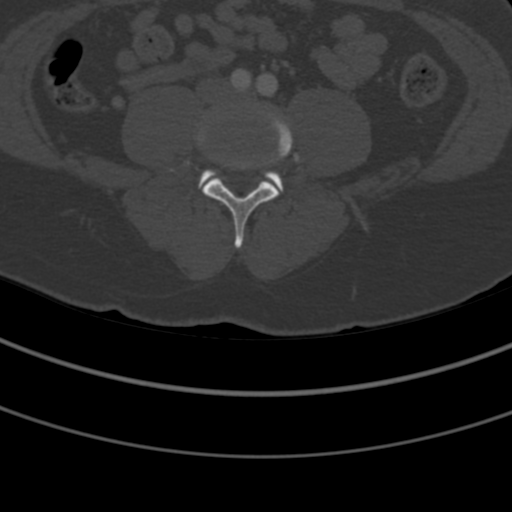
[im 127/254  bone]
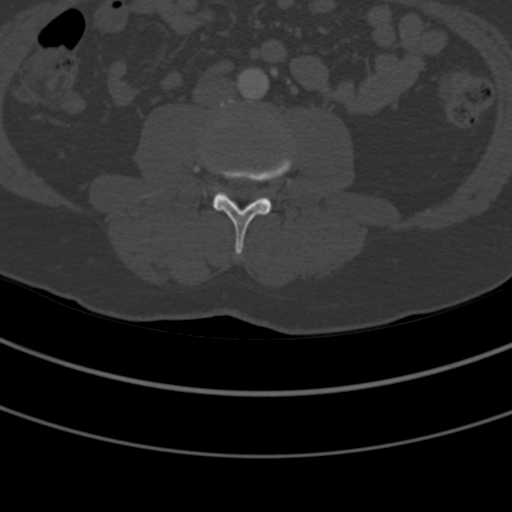
[im 159/254  soft-tissue]
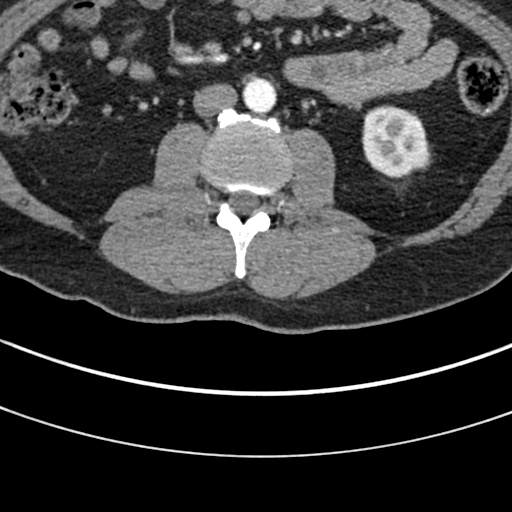
[im 159/254  bone]
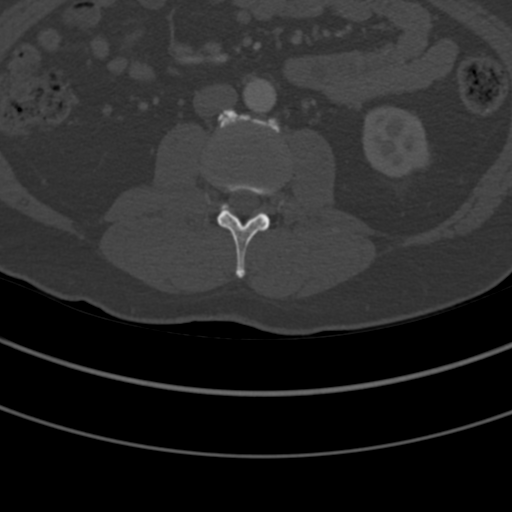
[im 190/254  bone]
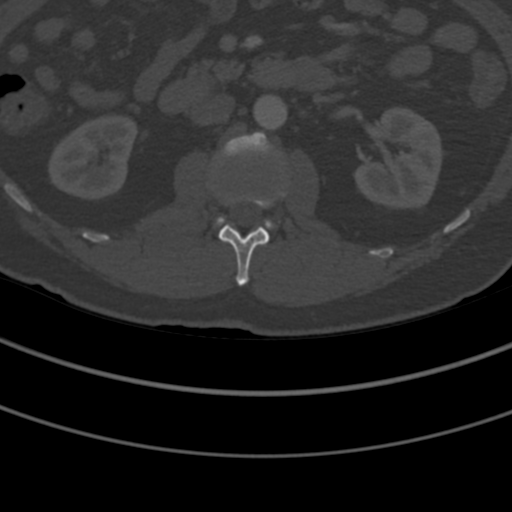
[im 222/254  bone]
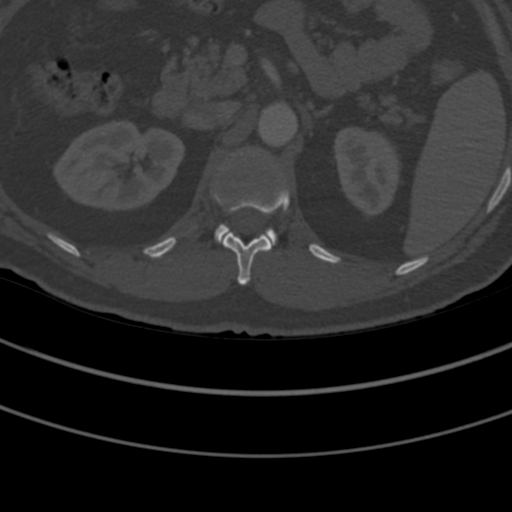

[Series 602: sagittal bone · sagittal · 0.55mm/px · 5 of 48 slices shown, 6 images]
[im 16/48  bone]
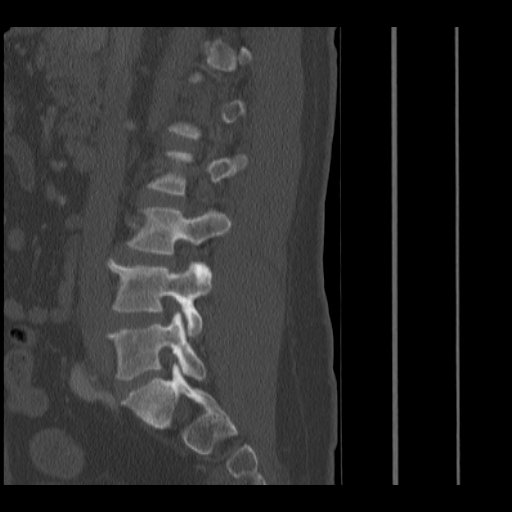
[im 20/48  bone]
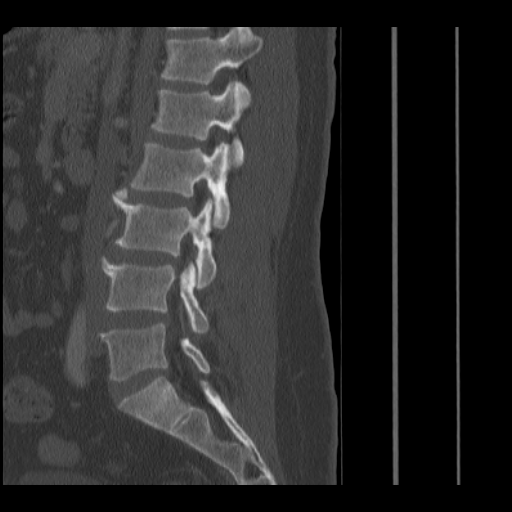
[im 24/48  soft-tissue]
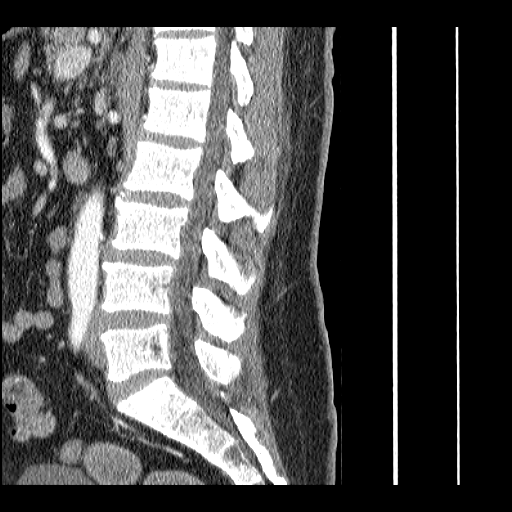
[im 24/48  bone]
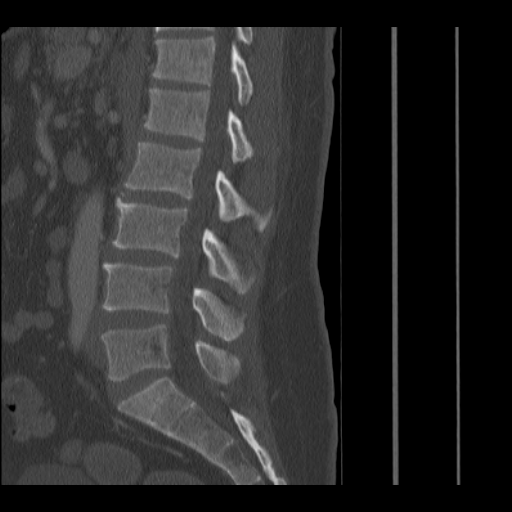
[im 28/48  bone]
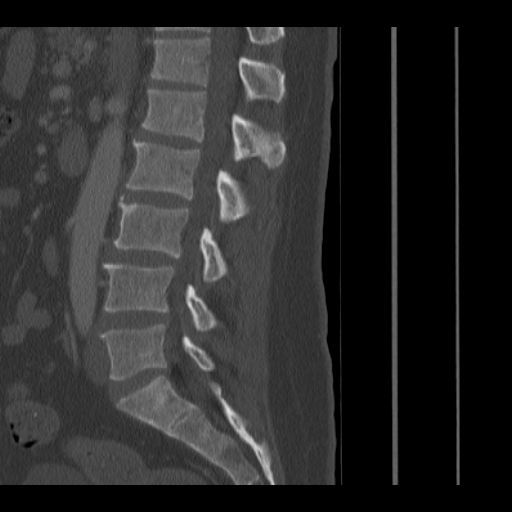
[im 32/48  bone]
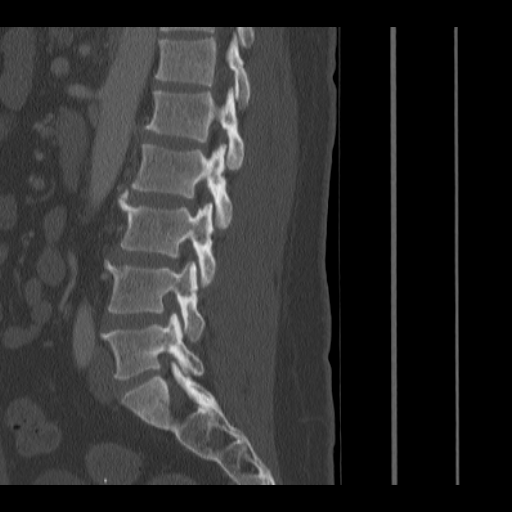

[Series 603: coronal bone · coronal · 0.55mm/px · 3 of 56 slices shown]
[im 12/56  bone]
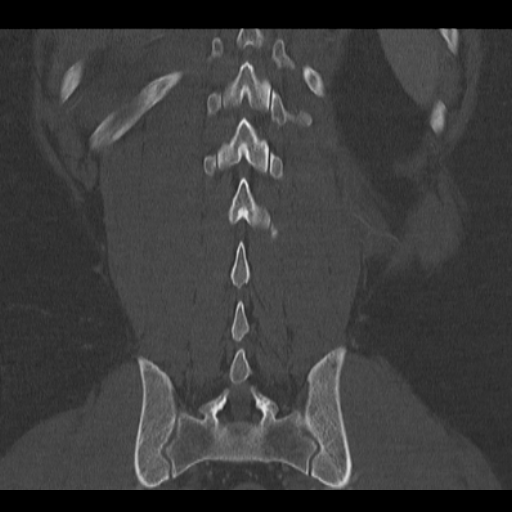
[im 23/56  bone]
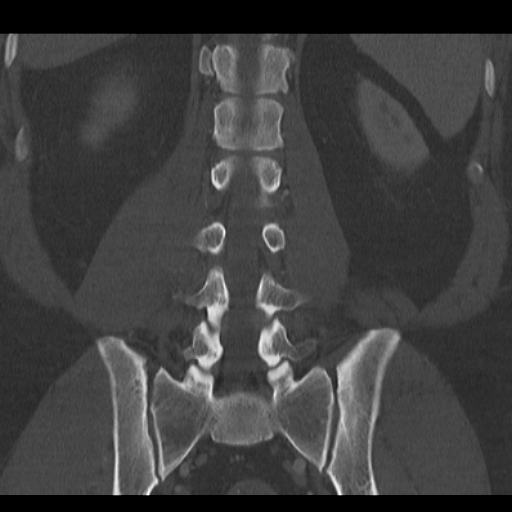
[im 34/56  bone]
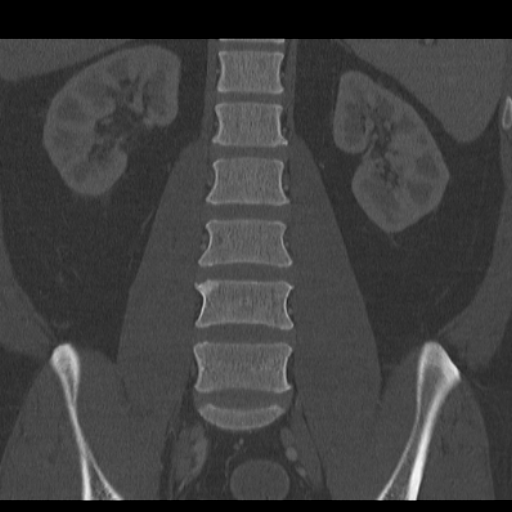

[15 of 33 positions shown; findings below may reference images not displayed]

FINDINGS: Segmentation: There are five lumbar type vertebral bodies. The last
full intervertebral disc space is labeled L5-S1.

Alignment: Normal

Vertebrae: No acute vertebral body fractures are identified. The
vertebral bodies are maintained. The facets are normally aligned. No
facet or laminar fractures.

There are are however right-sided transverse process fractures at
L1, L2 and L3.

Paraspinal and other soft tissues: No significant findings. Small
amount of intramuscular hematoma in the right paraspinal muscles
associated with the transverse process fractures.

Disc levels: No large disc protrusions, spinal or foraminal stenosis
in the lumbar spine.
IMPRESSION: 1. Nondisplaced right-sided transverse process fractures at L1, L2
and L3. Small amount of associated intramuscular hematoma.
2. No vertebral body or facet fractures and normal alignment of the
lumbar spine.

## 2019-04-28 MED ORDER — HYDROCODONE-ACETAMINOPHEN 5-325 MG PO TABS: 1.0000 | ORAL_TABLET | ORAL | 0 refills | Status: DC | PRN

## 2019-04-28 MED ORDER — ONDANSETRON HCL 4 MG/2ML IJ SOLN
4.0000 mg | Freq: Once | INTRAMUSCULAR | Status: AC
  Administered 2019-04-28: 14:00:00 4 mg via INTRAVENOUS
  Filled 2019-04-28: qty 2

## 2019-04-28 MED ORDER — MORPHINE SULFATE (PF) 4 MG/ML IV SOLN
4.0000 mg | Freq: Once | INTRAVENOUS | Status: AC
  Administered 2019-04-28: 14:00:00 4 mg via INTRAVENOUS
  Filled 2019-04-28: qty 1

## 2019-04-28 MED ORDER — IOHEXOL 300 MG/ML  SOLN
100.0000 mL | Freq: Once | INTRAMUSCULAR | Status: AC | PRN
  Administered 2019-04-28: 100 mL via INTRAVENOUS

## 2019-04-28 NOTE — Progress Notes (Signed)
Orthopedic Tech Progress Note Patient Details:  [name] Nov 04, 1965 [id number]  Patient ID: Alexander Schroeder, male   DOB: Feb 03, 1966, 53 y.o.   MRN: [id number]   [name] 04/28/2019, 6:10 PMLumbar brace

## 2019-04-28 NOTE — ED Provider Notes (Signed)
[facility] DEPARTMENT Provider Note   CSN: [id number] Arrival date & time: 04/28/19  1322     History   Chief Complaint Chief Complaint  Patient presents with  . Fall    HPI Arlyn Buerkle is a 53 y.o. male.     HPI   Vernis Eid is a 53 y.o. male, with a history of anxiety, presenting to the ED for evaluation following a fall that occurred shortly prior to arrival. Patient states he was working at the top of an 8 foot ladder, he thinks the ladder slipped, and he fell onto his deck.  He lost consciousness and does not know how long he was unconscious.  He states he did not lose consciousness prior to the fall and also denies chest pain, shortness of breath, dizziness, or other symptoms prior to the fall.  His wife called EMS. He complains of pain in his neck and back, rated 6/10, radiating up and down the spine.  He also complains of pain to the right ribs and right flank as well as nausea.  Denies vomiting, hip/pelvic pain, incontinence, weakness, numbness, shortness of breath, or any other complaints.   Past Medical History:  Diagnosis Date  . Anxiety     Patient Active Problem List   Diagnosis Date Noted  . Infected sebaceous cyst of skin 09/12/2014  . Anxiety 05/25/2012  . Insomnia 05/25/2012  . General medical examination 12/16/2010  . KNEE PAIN, RIGHT 11/02/2009    Past Surgical History:  Procedure Laterality Date  . WISDOM TOOTH EXTRACTION     age 7        Home Medications    Prior to Admission medications   Medication Sig Start Date End Date Taking? Authorizing Provider  citalopram (CELEXA) 20 MG tablet TAKE 1 TABLET ONCE DAILY. 11/26/18   Midge Minium, MD  FEROSUL 325 (65 Fe) MG tablet TAKE 1 TABLET EVERY DAY WITH BREAKFAST. 03/16/18   Midge Minium, MD  fluticasone (FLONASE) 50 MCG/ACT nasal spray Place 2 sprays into both nostrils daily. 06/14/16   Brunetta Jeans, PA-C    Family History Family  History  Problem Relation Age of Onset  . Heart disease Maternal Grandmother   . Diabetes Maternal Grandfather   . Diabetes Paternal Grandmother   . Heart disease Paternal Grandfather   . Breast cancer Mother   . Cancer Father   . Diabetes Father   . Colon cancer Neg Hx   . Rectal cancer Neg Hx   . Stomach cancer Neg Hx   . Esophageal cancer Neg Hx     Social History Social History   Tobacco Use  . Smoking status: Never Smoker  . Smokeless tobacco: Never Used  Substance Use Topics  . Alcohol use: No  . Drug use: No     Allergies   Patient has no known allergies.   Review of Systems Review of Systems  Constitutional: Negative for diaphoresis.  Respiratory: Negative for shortness of breath.   Gastrointestinal: Positive for abdominal pain and nausea. Negative for vomiting.  Musculoskeletal: Positive for back pain and neck pain.       Pain to the right chest  Neurological: Positive for syncope.  All other systems reviewed and are negative.    Physical Exam Updated Vital Signs BP (!) 156/85   Pulse (!) 57   Temp (!) 97.2 F (36.2 C) (Oral)   Resp 18   Ht 5\' 6"  (1.676 m)   Wt 77.1 kg  SpO2 100%   BMI 27.44 kg/m   Physical Exam Vitals signs and nursing note reviewed.  Constitutional:      General: He is not in acute distress.    Appearance: He is well-developed. He is not diaphoretic.  HENT:     Head: Normocephalic.     Comments: Scalp and face were examined without noted wounds, swelling, deformity, instability, tenderness, or other abnormality.    Mouth/Throat:     Mouth: Mucous membranes are moist.     Pharynx: Oropharynx is clear.  Eyes:     Conjunctiva/sclera: Conjunctivae normal.  Neck:     Musculoskeletal: Neck supple. Spinous process tenderness and muscular tenderness present.      Comments: Due to the patient's injury, neck pain, and cervical spine tenderness, he was placed in a cervical collar upon my assessment of the patient.  Cardiovascular:     Rate and Rhythm: Normal rate and regular rhythm.     Pulses: Normal pulses.          Radial pulses are 2+ on the right side and 2+ on the left side.       Posterior tibial pulses are 2+ on the right side and 2+ on the left side.     Heart sounds: Normal heart sounds.     Comments: Tactile temperature in the extremities appropriate and equal bilaterally. Pulmonary:     Effort: Pulmonary effort is normal. No respiratory distress.     Breath sounds: Normal breath sounds.  Chest:     Chest wall: Tenderness present. No deformity, swelling or crepitus.    Abdominal:     Palpations: Abdomen is soft.     Tenderness: There is abdominal tenderness. There is no guarding.    Musculoskeletal:     Thoracic back: He exhibits tenderness and pain.       Back:     Right lower leg: No edema.     Left lower leg: No edema.     Comments: No tenderness, swelling, or pain with movement of the major joints of the upper and lower extremities Overall trauma exam performed without any abnormalities noted other than those mentioned.  Skin:    General: Skin is warm and dry.  Neurological:     Mental Status: He is alert and oriented to person, place, and time.     Comments: Sensation light touch grossly intact in the extremities. Grip strength equal bilaterally. Strength 5/5 in the bilateral upper extremities. Strength 5/5 bilaterally in the quadriceps and knee extension, however, hip flexion causes pain in the back, making this movement difficult. Cranial nerves III through XII grossly intact. No facial droop.  Psychiatric:        Mood and Affect: Mood and affect normal.        Speech: Speech normal.        Behavior: Behavior normal.      ED Treatments / Results  Labs (all labs ordered are listed, but only abnormal results are displayed) Labs Reviewed  COMPREHENSIVE METABOLIC PANEL - Abnormal; Notable for the following components:      Result Value   Glucose, Bld 124 (*)     Total Bilirubin 1.3 (*)    All other components within normal limits  CBC WITH DIFFERENTIAL/PLATELET    EKG None  Radiology Dg Pelvis Portable  Result Date: 04/28/2019 CLINICAL DATA:  Fall 8 feet from a ladder today.  Right-sided pain. EXAM: PORTABLE PELVIS 1-2 VIEWS COMPARISON:  None. FINDINGS: There is no evidence of  pelvic fracture or diastasis. No pelvic bone lesions are seen. IMPRESSION: Negative. Electronically Signed   By: Gaylyn RongWalter  Liebkemann M.D.   On: 04/28/2019 14:36   Dg Chest Portable 1 View  Result Date: 04/28/2019 CLINICAL DATA:  Fall from a ladder about 8 feet today. Right rib pain. EXAM: PORTABLE CHEST 1 VIEW COMPARISON:  None. FINDINGS: No pulmonary contusion or pneumothorax identified. No appreciable displaced rib fracture. The lungs appear clear. Cardiac and mediastinal margins appear normal. IMPRESSION: No active disease. Electronically Signed   By: Gaylyn RongWalter  Liebkemann M.D.   On: 04/28/2019 14:34    Procedures Procedures (including critical care time)  Medications Ordered in ED Medications  ondansetron (ZOFRAN) injection 4 mg (4 mg Intravenous Given 04/28/19 1411)  morphine 4 MG/ML injection 4 mg (4 mg Intravenous Given 04/28/19 1411)     Initial Impression / Assessment and Plan / ED Course  I have reviewed the triage vital signs and the nursing notes.  Pertinent labs & imaging results that were available during my care of the patient were reviewed by me and considered in my medical decision making (see chart for details).        Patient presents for evaluation following a fall. No focal neuro deficits on exam. Nontoxic-appearing, no tachycardia, no hypotension, no tachypnea.  Findings and plan of care discussed with Marianna Fussichard Dykstra, MD. Dr. Stevie Kernykstra personally evaluated and examined this patient.  End of shift patient care handoff report given to Dr. Monna FamVaithi, EM resident.  Plan: CT scans pending. If normal, ambulate patient and discharge with concussion  clinic referral.  Vitals:   04/28/19 1326 04/28/19 1327 04/28/19 1328 04/28/19 1400  BP: (!) 156/85  (!) 156/85   Pulse: (!) 57   (!) 56  Resp:   18 20  Temp:   (!) 97.2 F (36.2 C)   TempSrc:   Oral   SpO2: 100%   100%  Weight:  77.1 kg    Height:  5\' 6"  (1.676 m)       Final Clinical Impressions(s) / ED Diagnoses   Final diagnoses:  Back pain  Fall, initial encounter    ED Discharge Orders    None       Concepcion LivingJoy, Syrianna Schillaci C, PA-C 04/28/19 1521    Milagros Lollykstra, Richard S, MD 04/30/19 2326

## 2019-04-28 NOTE — ED Provider Notes (Signed)
  Physical Exam  BP (!) 156/85   Pulse (!) 56   Temp (!) 97.2 F (36.2 C) (Oral)   Resp 20   Ht 5\' 6"  (1.676 m)   Wt 77.1 kg   SpO2 100%   BMI 27.44 kg/m   Physical Exam Vitals signs and nursing note reviewed.  Constitutional:      Appearance: He is well-developed. He is not ill-appearing.  HENT:     Head: Normocephalic and atraumatic.  Eyes:     Conjunctiva/sclera: Conjunctivae normal.  Neck:     Musculoskeletal: Neck supple.     Comments: Cervical collar in place Cardiovascular:     Rate and Rhythm: Normal rate and regular rhythm.     Pulses: Normal pulses.     Heart sounds: No murmur.  Pulmonary:     Effort: Pulmonary effort is normal. No respiratory distress.     Breath sounds: Normal breath sounds.  Abdominal:     Palpations: Abdomen is soft.     Tenderness: There is no abdominal tenderness.  Skin:    General: Skin is warm and dry.  Neurological:     General: No focal deficit present.     Mental Status: He is alert and oriented to person, place, and time.     Motor: No weakness.     Comments: Moving all 4 extremities spontaneously, no sensation deficits       MDM  Patient reassessed at the time of handoff.  He is currently neurologically intact.   Patient is currently pain controlled.   Imaging has resulted and showed lumbar TP fractures in L1, L2, L3.  Neurosurgery consulted.  Cervical collar is cleared.  Neurosurgery recommends brace for comfort and follow-up as needed 1 month to 6 weeks.  Pain medicine sent to patient's pharmacy.  All questions answered.  Brace given in the ED.  Care of patient discussed with the supervising attending.     Julianne Rice, MD 04/28/19 1910    Maudie Flakes, MD 04/29/19 [phone]

## 2019-04-28 NOTE — ED Triage Notes (Signed)
Per EMS- pt was standing on a ladder at top step when it slipped. Pt fell to ground about 7-8 feet. Pt hit the back of his head. Arrives without C collar on. Pt has pain to back and front of head, pain to right shoulder and right ribs and right lumbar spine. Pt fell onto the deck. Pt reports LOC. Pt is currently A&OX4.

## 2019-04-30 ENCOUNTER — Encounter: Payer: Self-pay | Admitting: Family Medicine

## 2019-05-02 ENCOUNTER — Ambulatory Visit (INDEPENDENT_AMBULATORY_CARE_PROVIDER_SITE_OTHER): Payer: BC Managed Care – PPO | Admitting: Family Medicine

## 2019-05-02 ENCOUNTER — Other Ambulatory Visit: Payer: Self-pay

## 2019-05-02 ENCOUNTER — Encounter: Payer: Self-pay | Admitting: Family Medicine

## 2019-05-02 VITALS — Temp 97.5°F | Ht 66.0 in | Wt 170.0 lb

## 2019-05-02 DIAGNOSIS — S060X1A Concussion with loss of consciousness of 30 minutes or less, initial encounter: Secondary | ICD-10-CM | POA: Diagnosis not present

## 2019-05-02 DIAGNOSIS — S32009D Unspecified fracture of unspecified lumbar vertebra, subsequent encounter for fracture with routine healing: Secondary | ICD-10-CM | POA: Diagnosis not present

## 2019-05-02 NOTE — Progress Notes (Signed)
I have discussed the procedure for the virtual visit with the patient who has given consent to proceed with assessment and treatment.   Pt unable to obtain vitals.   Jessica L Brodmerkel, CMA     

## 2019-05-02 NOTE — Progress Notes (Signed)
Virtual Visit via Video   I connected with patient on 05/02/19 at 11:00 AM EST by a video enabled telemedicine application and verified that I am speaking with the correct person using two identifiers.  Location patient: Home Location provider: [facility], [facility] Persons participating in the virtual visit: Patient, Provider, CMA (Jess B)  I discussed the limitations of evaluation and management by telemedicine and the availability of in person appointments. The patient expressed understanding and agreed to proceed.  Subjective:   HPI:   ER f/u- pt sustained fall from ladder on 11/15.  Pt was on 8 foot ladder and fell onto wooden deck.  Fractured transverse processes of L1-3.  Having difficulty moving around.  Sleeping ok.  Continues to have HA- 'dull pain'.  HA is frontal and at occipital junction.  + dizziness w/ changing positions.  Lightheaded w/ prolonged walking/activity.  Not taking Hydrocodone at this time.  Has been working from home on his tablet.  Worsening HAs w/ screen time.  ROS:   See pertinent positives and negatives per HPI.  Patient Active Problem List   Diagnosis Date Noted  . Infected sebaceous cyst of skin 09/12/2014  . Anxiety 05/25/2012  . Insomnia 05/25/2012  . General medical examination 12/16/2010  . KNEE PAIN, RIGHT 11/02/2009    Social History   Tobacco Use  . Smoking status: Never Smoker  . Smokeless tobacco: Never Used  Substance Use Topics  . Alcohol use: No    Current Outpatient Medications:  .  citalopram (CELEXA) 20 MG tablet, TAKE 1 TABLET ONCE DAILY. (Patient taking differently: Take 20 mg by mouth daily. ), Disp: 90 tablet, Rfl: 1 .  FEROSUL 325 (65 Fe) MG tablet, TAKE 1 TABLET EVERY DAY WITH BREAKFAST. (Patient taking differently: Take 325 mg by mouth daily with breakfast. ), Disp: 30 tablet, Rfl: 6 .  fluticasone (FLONASE) 50 MCG/ACT nasal spray, Place 2 sprays into both nostrils daily., Disp: 16 g, Rfl: 6 .   HYDROcodone-acetaminophen (NORCO/VICODIN) 5-325 MG tablet, Take 1 tablet by mouth every 4 (four) hours as needed., Disp: 12 tablet, Rfl: 0  No Known Allergies  Objective:   Temp (!) 97.5 F (36.4 C) (Tympanic)   Ht 5\' 6"  (1.676 m)   Wt 170 lb (77.1 kg)   BMI 27.44 kg/m   AAOx3, NAD NCAT, EOMI No obvious CN deficits Coloring WNL Pt is able to speak clearly, coherently without shortness of breath or increased work of breathing.  Thought process is linear.  Mood is appropriate.   Assessment and Plan:   Concussion- new.  Pt sustained fall from 8 ft ladder and lost consciousness.  Since then, he reports dull frontal HA and HA at occipital jxn which would be consistent w/ an impact and contrecoup injuries.  Reviewed this w/ pt and upon further questioning, he has dizziness w/ activity, worsening headache w/ screen time or reading.  Denies N/V at this time.  No obvious photophobia.  Reviewed concussion diagnosis and need for both cognitive and physical rest.  Reviewed head CT at time of accident that showed no hemorrhage.  Pt is agreeable to take next few days off of work to allow for cognitive rest and is aware to monitor symptoms.  I will call him on Monday to check in and see how he is doing.  Pt expressed understanding and is in agreement w/ plan.   Fractured lumbar transverse processes- L1-3.  Pt is doing remarkably well and has not used any of his narcotic  pain medication.  Is only using ES tylenol.  Encouraged him to use meds if needed and to resume activity slowly.  He only needs f/u w/ neurosurgery if pain persists after the first few weeks.  Pt expressed understanding and is in agreement w/ plan.      Annye Asa, MD 05/02/2019

## 2019-05-21 ENCOUNTER — Other Ambulatory Visit: Payer: Self-pay | Admitting: Family Medicine

## 2019-06-19 ENCOUNTER — Encounter: Payer: Self-pay | Admitting: Family Medicine

## 2019-06-19 ENCOUNTER — Ambulatory Visit (INDEPENDENT_AMBULATORY_CARE_PROVIDER_SITE_OTHER): Payer: BC Managed Care – PPO | Admitting: Family Medicine

## 2019-06-19 ENCOUNTER — Other Ambulatory Visit: Payer: Self-pay

## 2019-06-19 VITALS — Temp 96.9°F | Ht 66.0 in | Wt 170.0 lb

## 2019-06-19 DIAGNOSIS — S060X1A Concussion with loss of consciousness of 30 minutes or less, initial encounter: Secondary | ICD-10-CM

## 2019-06-19 DIAGNOSIS — S32009D Unspecified fracture of unspecified lumbar vertebra, subsequent encounter for fracture with routine healing: Secondary | ICD-10-CM

## 2019-06-19 NOTE — Progress Notes (Signed)
I have discussed the procedure for the virtual visit with the patient who has given consent to proceed with assessment and treatment.   Teesha Ohm L Hobert Poplaski, CMA     

## 2019-06-19 NOTE — Progress Notes (Signed)
   Virtual Visit via Video   I connected with patient on 06/19/19 at  3:00 PM EST by a video enabled telemedicine application and verified that I am speaking with the correct person using two identifiers.  Location patient: Home Location provider: [facility], Office Persons participating in the virtual visit: Patient, Provider, CMA (Jess B)  I discussed the limitations of evaluation and management by telemedicine and the availability of in person appointments. The patient expressed understanding and agreed to proceed.  Subjective:   HPI:   Lumbar vertebrae fracture/concussion f/u- pt sustained fall from ladder on 11/15.  Did not follow up w/ neurosurg.  Feeling 'much better'.  Has some residual soreness where he hit his back.  No surface pain.  Most notable when rolling over at night.  No headaches or dizziness.    ROS:   See pertinent positives and negatives per HPI.  Patient Active Problem List   Diagnosis Date Noted  . Infected sebaceous cyst of skin 09/12/2014  . Anxiety 05/25/2012  . Insomnia 05/25/2012  . General medical examination 12/16/2010  . KNEE PAIN, RIGHT 11/02/2009    Social History   Tobacco Use  . Smoking status: Never Smoker  . Smokeless tobacco: Never Used  Substance Use Topics  . Alcohol use: No    Current Outpatient Medications:  .  citalopram (CELEXA) 20 MG tablet, TAKE 1 TABLET ONCE DAILY. (Patient taking differently: Take 20 mg by mouth daily. ), Disp: 90 tablet, Rfl: 1 .  FEROSUL 325 (65 Fe) MG tablet, TAKE 1 TABLET EVERY DAY WITH BREAKFAST., Disp: 90 tablet, Rfl: 0 .  fluticasone (FLONASE) 50 MCG/ACT nasal spray, Place 2 sprays into both nostrils daily., Disp: 16 g, Rfl: 6  No Known Allergies  Objective:   Temp (!) 96.9 F (36.1 C) (Temporal)   Ht 5\' 6"  (1.676 m)   Wt 170 lb (77.1 kg)   BMI 27.44 kg/m  AAOx3, NAD NCAT, EOMI No obvious CN deficits Coloring WNL Pt is able to speak clearly, coherently without shortness of  breath or increased work of breathing.  Thought process is linear.  Mood is appropriate.   Assessment and Plan:   fx vertebrae- healing w/ minimal residual pain at this time.  No need to f/u w/ neurosurgery at this time.  Pt is ok to try and increase his activity level slowly, scaling back if he has pain.  Concussion- resolved.  No longer having headaches and dizziness.  , MD 06/19/2019

## 2019-07-04 ENCOUNTER — Other Ambulatory Visit: Payer: Self-pay | Admitting: Family Medicine

## 2019-08-18 ENCOUNTER — Ambulatory Visit: Payer: BC Managed Care – PPO | Attending: Internal Medicine

## 2019-08-18 DIAGNOSIS — Z23 Encounter for immunization: Secondary | ICD-10-CM

## 2019-08-18 NOTE — Progress Notes (Signed)
   Covid-19 Vaccination Clinic  Name:  Devron Cohick    MRN: [id number] DOB: 10/05/1965  08/18/2019  Mr. Marineau was observed post Covid-19 immunization for 15 minutes without incident. He was provided with Vaccine Information Sheet and instruction to access the V-Safe system.   Mr. Shoultz was instructed to call 911 with any severe reactions post vaccine: Marland Kitchen Difficulty breathing  . Swelling of face and throat  . A fast heartbeat  . A bad rash all over body  . Dizziness and weakness   Immunizations Administered    Name Date Dose VIS Date Route   Pfizer COVID-19 Vaccine 08/18/2019 11:23 AM 0.3 mL 05/24/2019 Intramuscular   Manufacturer: [employer], [employer]   Lot: [id number]   NDC: [address]-8

## 2019-09-18 ENCOUNTER — Ambulatory Visit: Payer: BC Managed Care – PPO | Attending: Internal Medicine

## 2019-09-18 DIAGNOSIS — Z23 Encounter for immunization: Secondary | ICD-10-CM

## 2019-09-18 NOTE — Progress Notes (Signed)
   Covid-19 Vaccination Clinic  Name:  Alexander Schroeder    MRN: [id number] DOB: 11-Jul-1965  09/18/2019  Alexander Schroeder was observed post Covid-19 immunization for 15 minutes without incident. He was provided with Vaccine Information Sheet and instruction to access the V-Safe system.   Alexander Schroeder was instructed to call 911 with any severe reactions post vaccine: Marland Kitchen Difficulty breathing  . Swelling of face and throat  . A fast heartbeat  . A bad rash all over body  . Dizziness and weakness   Immunizations Administered    Name Date Dose VIS Date Route   Pfizer COVID-19 Vaccine 09/18/2019  8:21 AM 0.3 mL 05/24/2019 Intramuscular   Manufacturer: [employer], [employer]   Lot: [id number]   NDC: [address]-0

## 2019-10-20 ENCOUNTER — Other Ambulatory Visit: Payer: Self-pay | Admitting: Family Medicine

## 2019-12-19 ENCOUNTER — Other Ambulatory Visit: Payer: Self-pay | Admitting: Family Medicine

## 2019-12-19 NOTE — Telephone Encounter (Signed)
Last CBC drawn on 03/22/19 and hemoglobin at 15.8 LOV 06/19/19 for fracture NOV 03/27/20

## 2020-02-21 ENCOUNTER — Other Ambulatory Visit: Payer: Self-pay | Admitting: Family Medicine

## 2020-03-27 ENCOUNTER — Other Ambulatory Visit: Payer: Self-pay

## 2020-03-27 ENCOUNTER — Encounter: Payer: Self-pay | Admitting: Family Medicine

## 2020-03-27 ENCOUNTER — Encounter: Payer: Self-pay | Admitting: General Practice

## 2020-03-27 ENCOUNTER — Ambulatory Visit (INDEPENDENT_AMBULATORY_CARE_PROVIDER_SITE_OTHER): Payer: BC Managed Care – PPO | Admitting: Family Medicine

## 2020-03-27 VITALS — BP 121/80 | HR 76 | Temp 97.9°F | Resp 17 | Ht 66.0 in | Wt 170.1 lb

## 2020-03-27 DIAGNOSIS — Z0000 Encounter for general adult medical examination without abnormal findings: Secondary | ICD-10-CM

## 2020-03-27 DIAGNOSIS — E663 Overweight: Secondary | ICD-10-CM

## 2020-03-27 DIAGNOSIS — Z125 Encounter for screening for malignant neoplasm of prostate: Secondary | ICD-10-CM | POA: Diagnosis not present

## 2020-03-27 DIAGNOSIS — Z23 Encounter for immunization: Secondary | ICD-10-CM | POA: Diagnosis not present

## 2020-03-27 DIAGNOSIS — K602 Anal fissure, unspecified: Secondary | ICD-10-CM

## 2020-03-27 LAB — BASIC METABOLIC PANEL
BUN: 18 mg/dL (ref 6–23)
CO2: 30 mEq/L (ref 19–32)
Calcium: 9 mg/dL (ref 8.4–10.5)
Chloride: 103 mEq/L (ref 96–112)
Creatinine, Ser: 1.19 mg/dL (ref 0.40–1.50)
GFR: 67.54 mL/min (ref >60.00)
Glucose, Bld: 91 mg/dL (ref 70–99)
Potassium: 4.5 mEq/L (ref 3.5–5.1)
Sodium: 139 mEq/L (ref 135–145)

## 2020-03-27 LAB — CBC WITH DIFFERENTIAL/PLATELET
Basophils Absolute: 0 10*3/uL (ref 0.0–0.1)
Basophils Relative: 0.9 % (ref 0.0–3.0)
Eosinophils Absolute: 0.1 10*3/uL (ref 0.0–0.7)
Eosinophils Relative: 2.9 % (ref 0.0–5.0)
HCT: 45.8 % (ref 39.0–52.0)
Hemoglobin: 15 g/dL (ref 13.0–17.0)
Lymphocytes Relative: 23.2 % (ref 12.0–46.0)
Lymphs Abs: 0.8 10*3/uL (ref 0.7–4.0)
MCHC: 32.9 g/dL (ref 30.0–36.0)
MCV: 83.8 fl (ref 78.0–100.0)
Monocytes Absolute: 0.3 10*3/uL (ref 0.1–1.0)
Monocytes Relative: 8.4 % (ref 3.0–12.0)
Neutro Abs: 2.2 10*3/uL (ref 1.4–7.7)
Neutrophils Relative %: 64.6 % (ref 43.0–77.0)
Platelets: 185 10*3/uL (ref 150.0–400.0)
RBC: 5.46 Mil/uL (ref 4.22–5.81)
RDW: 14.5 % (ref 11.5–15.5)
WBC: 3.4 10*3/uL — ABNORMAL LOW (ref 4.0–10.5)

## 2020-03-27 LAB — LIPID PANEL
Cholesterol: 190 mg/dL (ref 0–200)
HDL: 38.7 mg/dL — ABNORMAL LOW (ref >39.00)
LDL Cholesterol: 129 mg/dL — ABNORMAL HIGH (ref 0–99)
NonHDL: 151.52
Total CHOL/HDL Ratio: 5
Triglycerides: 113 mg/dL (ref 0.0–149.0)
VLDL: 22.6 mg/dL (ref 0.0–40.0)

## 2020-03-27 LAB — HEPATIC FUNCTION PANEL
ALT: 16 U/L (ref 0–53)
AST: 18 U/L (ref 0–37)
Albumin: 4.3 g/dL (ref 3.5–5.2)
Alkaline Phosphatase: 109 U/L (ref 39–117)
Bilirubin, Direct: 0.1 mg/dL (ref 0.0–0.3)
Total Bilirubin: 0.7 mg/dL (ref 0.2–1.2)
Total Protein: 6.8 g/dL (ref 6.0–8.3)

## 2020-03-27 LAB — TSH: TSH: 1.45 u[IU]/mL (ref 0.35–4.50)

## 2020-03-27 LAB — PSA: PSA: 0.94 ng/mL (ref 0.10–4.00)

## 2020-03-27 NOTE — Patient Instructions (Signed)
Schedule your complete physical in 6 months We'll notify you of your lab results and make any changes if needed Continue to work on healthy diet and regular exercise- you're doing great! We'll call you with your GI referral to completely assess what looks to be an anal fissure Call with any questions or concerns Have a great weekend!

## 2020-03-27 NOTE — Assessment & Plan Note (Signed)
Pt's PE WNL w/ exception of new anal fissure.  UTD on colonoscopy, Tdap, COVID.  Flu shot given today.  Check labs.  Anticipatory guidance provided.

## 2020-03-27 NOTE — Addendum Note (Signed)
Addended by: Lana Fish on: 03/27/2020 08:47 AM   Modules accepted: Orders

## 2020-03-27 NOTE — Progress Notes (Signed)
   Subjective:    Patient ID: Alexander Schroeder, male    DOB: 1965-07-25, 54 y.o.   MRN: [id number]  HPI CPE- UTD on Tdap, COVID, colonoscopy.  Will get flu shot today.  No concerns.  Reviewed past medical, surgical, family and social histories.   Patient Care Team    Relationship Specialty Notifications Start End  Sheliah Hatch, MD PCP - General   05/26/10   Hart Carwin, MD (Inactive)  Gastroenterology  02/12/15     Health Maintenance  Topic Date Due  . INFLUENZA VACCINE  01/12/2020  . Hepatitis C Screening  03/27/2021 (Originally Dec 18, 1965)  . HIV Screening  03/27/2021 (Originally 07/08/1978)  . TETANUS/TDAP  01/10/2022  . COLONOSCOPY  04/11/2024  . COVID-19 Vaccine  Completed      Review of Systems Patient reports no vision/hearing changes, anorexia, fever ,adenopathy, persistant/recurrent hoarseness, swallowing issues, chest pain, palpitations, edema, persistant/recurrent cough, hemoptysis, dyspnea (rest,exertional, paroxysmal nocturnal), gastrointestinal  bleeding (melena, rectal bleeding), abdominal pain, excessive heart burn, GU symptoms (dysuria, hematuria, voiding/incontinence issues) syncope, focal weakness, memory loss, numbness & tingling, skin/hair/nail changes, depression, anxiety, abnormal bruising/bleeding, musculoskeletal symptoms/signs.   This visit occurred during the SARS-CoV-2 public health emergency.  Safety protocols were in place, including screening questions prior to the visit, additional usage of staff PPE, and extensive cleaning of exam room while observing appropriate contact time as indicated for disinfecting solutions.       Objective:   Physical Exam BP 121/80   Pulse 76   Temp 97.9 F (36.6 C) (Skin)   Resp 17   Ht 5\' 6"  (1.676 m)   Wt 170 lb 2 oz (77.2 kg)   SpO2 99%   BMI 27.46 kg/m   General Appearance:    Alert, cooperative, no distress, appears stated age  Head:    Normocephalic, without obvious abnormality, atraumatic  Eyes:     PERRL, conjunctiva/corneas clear, EOM's intact, fundi    benign, both eyes       Ears:    Normal TM's and external ear canals, both ears  Nose:   Deferred due to COVID  Throat:   Neck:   Supple, symmetrical, trachea midline, no adenopathy;       thyroid:  No enlargement/tenderness/nodules  Back:     Symmetric, no curvature, ROM normal, no CVA tenderness  Lungs:     Clear to auscultation bilaterally, respirations unlabored  Chest wall:    No tenderness or deformity  Heart:    Regular rate and rhythm, S1 and S2 normal, no murmur, rub   or gallop  Abdomen:     Soft, non-tender, bowel sounds active all four quadrants,    no masses, no organomegaly  Genitalia:    Normal male without lesion, discharge or tenderness  Rectal:    Normal tone, normal prostate, no masses, large fissure at 7 o'clock position  Extremities:   Extremities normal, atraumatic, no cyanosis or edema  Pulses:   2+ and symmetric all extremities  Skin:   Skin color, texture, turgor normal, no rashes or lesions  Lymph nodes:   Cervical, supraclavicular, and axillary nodes normal  Neurologic:   CNII-XII intact. Normal strength, sensation and reflexes      throughout          Assessment & Plan:  Anal fissure- new.  Refer to GI to ensure no underlying issues and tx.  Pt expressed understanding and is in agreement w/ plan.

## 2020-05-04 ENCOUNTER — Ambulatory Visit: Payer: BC Managed Care – PPO | Attending: Internal Medicine

## 2020-05-04 DIAGNOSIS — Z23 Encounter for immunization: Secondary | ICD-10-CM

## 2020-05-04 NOTE — Progress Notes (Signed)
   Covid-19 Vaccination Clinic  Name:  [name]    MRN: [id number] DOB: September 26, 1965  05/04/2020  [name] was observed post Covid-19 immunization for 15 minutes without incident. He was provided with Vaccine Information Sheet and instruction to access the V-Safe system.   [name] was instructed to call 911 with any severe reactions post vaccine: Marland Kitchen Difficulty breathing  . Swelling of face and throat  . A fast heartbeat  . A bad rash all over body  . Dizziness and weakness   Immunizations Administered    Name Date Dose VIS Date Route   Pfizer COVID-19 Vaccine 05/04/2020  4:41 PM 0.3 mL 04/01/2020 Intramuscular   Manufacturer: [employer], [employer]   Lot: [id number]   NDC: [address]-2

## 2020-05-05 ENCOUNTER — Ambulatory Visit: Payer: BC Managed Care – PPO

## 2020-05-25 ENCOUNTER — Encounter: Payer: Self-pay | Admitting: Gastroenterology

## 2020-05-25 ENCOUNTER — Ambulatory Visit: Payer: BC Managed Care – PPO | Admitting: Gastroenterology

## 2020-05-25 VITALS — BP 166/84 | HR 62 | Ht 66.0 in | Wt 174.0 lb

## 2020-05-25 DIAGNOSIS — L290 Pruritus ani: Secondary | ICD-10-CM | POA: Diagnosis not present

## 2020-05-25 DIAGNOSIS — K629 Disease of anus and rectum, unspecified: Secondary | ICD-10-CM

## 2020-05-25 NOTE — Patient Instructions (Signed)
If you are age 56 or older, your body mass index should be between 23-30. Your Body mass index is 28.08 kg/m. If this is out of the aforementioned range listed, please consider follow up with your Primary Care Provider.  If you are age 77 or younger, your body mass index should be between 19-25. Your Body mass index is 28.08 kg/m. If this is out of the aformentioned range listed, please consider follow up with your Primary Care Provider.   We will send your records [facility].Make certain to bring a list of current medications, including any over the counter medications or vitamins. Also bring your co-pay if you have one as well as your insurance cards. [facility] is located at [address]. Should you need to reschedule your appointment, please contact them at [phone].  It was a pleasure to see you today!  Dr. Myrtie Neither

## 2020-05-25 NOTE — Progress Notes (Signed)
[facility] Consult Note:  History: Alexander Schroeder 05/25/2020  Referring provider: Sheliah Hatch, MD  Reason for consult/chief complaint: Anal Itching (Persistent anal itching that has gotten worse over the last year. No rectal pain or changes in bowel habits. ? Hemorrhoids.)   Subjective  HPI:  This is a very pleasant 54 year old man referred by primary care for perianal itching.  It has been occurring for about a year and he also feels as if there is an abnormality in that area "like scar tissue".  He denies abdominal pain, his bowel habits are regular, he does not have seepage or incontinence, and does not get any drainage from the perianal area in question.  He denies any chronic upper digestive symptoms. Normal screening colonoscopy with Dr. Juanda Chance in October 2015.   ROS:  Review of Systems  Constitutional: Negative for appetite change and unexpected weight change.  HENT: Negative for mouth sores and voice change.   Eyes: Negative for pain and redness.  Respiratory: Negative for cough and shortness of breath.   Cardiovascular: Negative for chest pain and palpitations.  Genitourinary: Negative for dysuria and hematuria.  Musculoskeletal: Negative for arthralgias and myalgias.  Skin: Negative for pallor and rash.  Neurological: Negative for weakness and headaches.  Hematological: Negative for adenopathy.     Past Medical History: Past Medical History:  Diagnosis Date  . Anal fissure   . Anxiety   . Elevated PSA      Past Surgical History: Past Surgical History:  Procedure Laterality Date  . WISDOM TOOTH EXTRACTION     age 45     Family History: Family History  Problem Relation Age of Onset  . Heart disease Maternal Grandmother   . Diabetes Maternal Grandfather   . Diabetes Paternal Grandmother   . Heart disease Paternal Grandfather   . Breast cancer Mother   . Cancer Father   . Diabetes Father   . Colon cancer Neg Hx   .  Rectal cancer Neg Hx   . Stomach cancer Neg Hx   . Esophageal cancer Neg Hx     Social History: Social History   Socioeconomic History  . Marital status: Married    Spouse name: Not on file  . Number of children: Not on file  . Years of education: Not on file  . Highest education level: Not on file  Occupational History  . Not on file  Tobacco Use  . Smoking status: Never Smoker  . Smokeless tobacco: Never Used  Vaping Use  . Vaping Use: Never used  Substance and Sexual Activity  . Alcohol use: No  . Drug use: No  . Sexual activity: Not on file  Other Topics Concern  . Not on file  Social History Narrative   Lives with wife and 2 kids (1998, 38).         Social Determinants of Health   Financial Resource Strain: Not on file  Food Insecurity: Not on file  Transportation Needs: Not on file  Physical Activity: Not on file  Stress: Not on file  Social Connections: Not on file    Allergies: No Known Allergies  Outpatient Meds: Current Outpatient Medications  Medication Sig Dispense Refill  . citalopram (CELEXA) 20 MG tablet TAKE 1 TABLET ONCE DAILY. 90 tablet 0  . FEROSUL 325 (65 Fe) MG tablet TAKE 1 TABLET EVERY DAY WITH BREAKFAST. 100 tablet 0  . fluticasone (FLONASE) 50 MCG/ACT nasal spray Place 2 sprays into both nostrils daily. 16  g 6   No current facility-administered medications for this visit.      ___________________________________________________________________ Objective   Exam:  BP (!) 166/84   Pulse 62   Ht 5\' 6"  (1.676 m)   Wt 174 lb (78.9 kg)   BMI 28.08 kg/m    General: Well-appearing  Eyes: sclera anicteric, no redness  ENT: oral mucosa moist without lesions, no cervical or supraclavicular lymphadenopathy  CV: RRR without murmur, S1/S2, no JVD, no peripheral edema  Resp: clear to auscultation bilaterally, normal RR and effort noted  GI: soft, no tenderness, with active bowel sounds. No guarding or palpable organomegaly  noted.  Skin; warm and dry, no rash or jaundice noted.  No rash (see below)  Neuro: awake, alert and oriented x 3. Normal gross motor function and fluent speech Perianal exam reveals an approximately 2 cm fissured area of skin but not within the anal canal.  It is at about the 2 to 3 o'clock position beginning just distal to the anoderm.  The edges are raised, the tissue is rough and somewhat firm.  There is no drainage from the area and the fissure appears superficial.  DRE normal, no fissure or fistula felt within the anal canal, no palpable internal lesions. Anoscopy normal   Assessment: Encounter Diagnoses  Name Primary?  . Pruritus ani Yes  . Perianal lesion    Unknown if the skin lesion has been present for the entire year or so that he has had perianal pruritus.  It is certainly chronic appearing, does not look like a fistula, not obviously neoplastic.  I would not expect this finding to the result of mechanical trauma such as excess scratching or wiping, and he has no chronic digestive symptoms or altered bowel habits  Plan: Some patient information regarding pruritus a night causes and treatments were given  I have referred him to colorectal surgery for visual inspection and possible biopsy of the perianal lesion.   Thank you for the courtesy of this consult.  Please call me with any questions or concerns.  III  CC: Referring provider noted above

## 2020-05-26 ENCOUNTER — Telehealth: Payer: Self-pay

## 2020-05-26 ENCOUNTER — Other Ambulatory Visit: Payer: Self-pay | Admitting: Family Medicine

## 2020-05-26 NOTE — Telephone Encounter (Signed)
Records have been faxed to [facility]

## 2020-05-29 NOTE — Telephone Encounter (Signed)
Patient has been scheduled for 07-03-2020 with Dr Cliffton Asters

## 2020-06-19 ENCOUNTER — Other Ambulatory Visit: Payer: Self-pay

## 2020-06-19 DIAGNOSIS — Z20822 Contact with and (suspected) exposure to covid-19: Secondary | ICD-10-CM

## 2020-06-23 LAB — NOVEL CORONAVIRUS, NAA: SARS-CoV-2, NAA: NOT DETECTED

## 2020-07-10 ENCOUNTER — Other Ambulatory Visit: Payer: Self-pay

## 2020-07-10 DIAGNOSIS — Z20822 Contact with and (suspected) exposure to covid-19: Secondary | ICD-10-CM

## 2020-07-11 LAB — NOVEL CORONAVIRUS, NAA: SARS-CoV-2, NAA: NOT DETECTED

## 2020-07-11 LAB — SARS-COV-2, NAA 2 DAY TAT

## 2020-08-16 ENCOUNTER — Other Ambulatory Visit: Payer: Self-pay | Admitting: Physician Assistant

## 2020-09-25 ENCOUNTER — Encounter: Payer: BC Managed Care – PPO | Admitting: Family Medicine

## 2020-10-07 ENCOUNTER — Encounter: Payer: Self-pay | Admitting: Family Medicine

## 2020-10-07 ENCOUNTER — Ambulatory Visit (INDEPENDENT_AMBULATORY_CARE_PROVIDER_SITE_OTHER): Payer: BC Managed Care – PPO | Admitting: Family Medicine

## 2020-10-07 ENCOUNTER — Other Ambulatory Visit: Payer: Self-pay

## 2020-10-07 VITALS — BP 130/80 | HR 50 | Temp 97.7°F | Resp 18 | Ht 65.5 in | Wt 170.6 lb

## 2020-10-07 DIAGNOSIS — R030 Elevated blood-pressure reading, without diagnosis of hypertension: Secondary | ICD-10-CM | POA: Diagnosis not present

## 2020-10-07 DIAGNOSIS — E663 Overweight: Secondary | ICD-10-CM

## 2020-10-07 DIAGNOSIS — E785 Hyperlipidemia, unspecified: Secondary | ICD-10-CM | POA: Diagnosis not present

## 2020-10-07 LAB — LIPID PANEL
Cholesterol: 156 mg/dL (ref 0–200)
HDL: 36.4 mg/dL — ABNORMAL LOW (ref >39.00)
LDL Cholesterol: 100 mg/dL — ABNORMAL HIGH (ref 0–99)
NonHDL: 119.37
Total CHOL/HDL Ratio: 4
Triglycerides: 97 mg/dL (ref 0.0–149.0)
VLDL: 19.4 mg/dL (ref 0.0–40.0)

## 2020-10-07 LAB — BASIC METABOLIC PANEL
BUN: 17 mg/dL (ref 6–23)
CO2: 28 mEq/L (ref 19–32)
Calcium: 9.2 mg/dL (ref 8.4–10.5)
Chloride: 104 mEq/L (ref 96–112)
Creatinine, Ser: 1.1 mg/dL (ref 0.40–1.50)
GFR: 74.65 mL/min (ref >60.00)
Glucose, Bld: 101 mg/dL — ABNORMAL HIGH (ref 70–99)
Potassium: 4.3 mEq/L (ref 3.5–5.1)
Sodium: 139 mEq/L (ref 135–145)

## 2020-10-07 LAB — TSH: TSH: 1.72 u[IU]/mL (ref 0.35–4.50)

## 2020-10-07 LAB — HEPATIC FUNCTION PANEL
ALT: 13 U/L (ref 0–53)
AST: 15 U/L (ref 0–37)
Albumin: 4.1 g/dL (ref 3.5–5.2)
Alkaline Phosphatase: 102 U/L (ref 39–117)
Bilirubin, Direct: 0.1 mg/dL (ref 0.0–0.3)
Total Bilirubin: 0.8 mg/dL (ref 0.2–1.2)
Total Protein: 6.5 g/dL (ref 6.0–8.3)

## 2020-10-07 LAB — CBC WITH DIFFERENTIAL/PLATELET
Basophils Absolute: 0 10*3/uL (ref 0.0–0.1)
Basophils Relative: 1 % (ref 0.0–3.0)
Eosinophils Absolute: 0.1 10*3/uL (ref 0.0–0.7)
Eosinophils Relative: 4.1 % (ref 0.0–5.0)
HCT: 42.9 % (ref 39.0–52.0)
Hemoglobin: 14.3 g/dL (ref 13.0–17.0)
Lymphocytes Relative: 27.9 % (ref 12.0–46.0)
Lymphs Abs: 0.8 10*3/uL (ref 0.7–4.0)
MCHC: 33.4 g/dL (ref 30.0–36.0)
MCV: 84.4 fl (ref 78.0–100.0)
Monocytes Absolute: 0.3 10*3/uL (ref 0.1–1.0)
Monocytes Relative: 8.8 % (ref 3.0–12.0)
Neutro Abs: 1.7 10*3/uL (ref 1.4–7.7)
Neutrophils Relative %: 58.2 % (ref 43.0–77.0)
Platelets: 180 10*3/uL (ref 150.0–400.0)
RBC: 5.08 Mil/uL (ref 4.22–5.81)
RDW: 14.5 % (ref 11.5–15.5)
WBC: 3 10*3/uL — ABNORMAL LOW (ref 4.0–10.5)

## 2020-10-07 NOTE — Patient Instructions (Signed)
Schedule your complete physical in October We'll notify you of your lab results and make any changes if needed Keep up the good work on healthy diet and regular exercise- you're doing great! Call with any questions or concerns Have a great summer!!!

## 2020-10-07 NOTE — Assessment & Plan Note (Signed)
Last LDL 129.  Not on medication, low risk for CAD other than age.  Given that he is exercising and losing weight will repeat lipids today.  Pt expressed understanding and is in agreement w/ plan.

## 2020-10-07 NOTE — Assessment & Plan Note (Signed)
At surgery visit in January BP was 156/86.  Today is much more reasonable at 130/80.  Currently asymptomatic.  Will continue to follow and address if BP continues to climb

## 2020-10-07 NOTE — Progress Notes (Signed)
   Subjective:    Patient ID: Alexander Schroeder, male    DOB: 1965-11-10, 55 y.o.   MRN: [id number]  HPI Overweight- BMI 27.96, pt is down 5 lbs since last visit.  Now running.  No CP, SOB, HAs, visual changes.  Hyperlipidemia- pt's last LDL 129.  No abd pain, N/V.  Pt is running and losing weight  Elevated BP- on 07/03/20 BP was 156/86 at General Surgery.  Today is 130/80.    Reviewed past medical, surgical, family and social histories.   Patient Care Team    Relationship Specialty Notifications Start End  Sheliah Hatch, MD PCP - General   05/26/10   Hart Carwin, MD (Inactive)  Gastroenterology  02/12/15     Health Maintenance  Topic Date Due  . Hepatitis C Screening  03/27/2021 (Originally 06/06/1966)  . HIV Screening  03/27/2021 (Originally 07/08/1978)  . INFLUENZA VACCINE  01/11/2021  . TETANUS/TDAP  01/10/2022  . COLONOSCOPY (Pts 45-65yrs Insurance coverage will need to be confirmed)  04/11/2024  . COVID-19 Vaccine  Completed  . HPV VACCINES  Aged Out      Review of Systems For ROS see HPI   This visit occurred during the SARS-CoV-2 public health emergency.  Safety protocols were in place, including screening questions prior to the visit, additional usage of staff PPE, and extensive cleaning of exam room while observing appropriate contact time as indicated for disinfecting solutions.       Objective:   Physical Exam Vitals reviewed.  Constitutional:      General: He is not in acute distress.    Appearance: Normal appearance. He is well-developed.  HENT:     Head: Normocephalic and atraumatic.  Eyes:     Conjunctiva/sclera: Conjunctivae normal.     Pupils: Pupils are equal, round, and reactive to light.  Neck:     Thyroid: No thyromegaly.  Cardiovascular:     Rate and Rhythm: Normal rate and regular rhythm.     Pulses: Normal pulses.     Heart sounds: Normal heart sounds. No murmur heard.   Pulmonary:     Effort: Pulmonary effort is normal. No respiratory  distress.     Breath sounds: Normal breath sounds.  Abdominal:     General: Bowel sounds are normal. There is no distension.     Palpations: Abdomen is soft.  Musculoskeletal:     Cervical back: Normal range of motion and neck supple.     Right lower leg: No edema.     Left lower leg: No edema.  Lymphadenopathy:     Cervical: No cervical adenopathy.  Skin:    General: Skin is warm and dry.  Neurological:     Mental Status: He is alert and oriented to person, place, and time.     Cranial Nerves: No cranial nerve deficit.  Psychiatric:        Behavior: Behavior normal.           Assessment & Plan:

## 2020-10-07 NOTE — Assessment & Plan Note (Signed)
Pt is down 5 lbs since last visit!  He is now running regularly.  Applauded his efforts.  Will continue to follow.

## 2020-11-12 ENCOUNTER — Other Ambulatory Visit: Payer: Self-pay | Admitting: Family Medicine

## 2020-11-27 ENCOUNTER — Telehealth: Payer: Self-pay | Admitting: Family Medicine

## 2020-11-27 ENCOUNTER — Telehealth (INDEPENDENT_AMBULATORY_CARE_PROVIDER_SITE_OTHER): Payer: BC Managed Care – PPO | Admitting: Registered Nurse

## 2020-11-27 DIAGNOSIS — U071 COVID-19: Secondary | ICD-10-CM | POA: Diagnosis not present

## 2020-11-27 MED ORDER — NIRMATRELVIR/RITONAVIR (PAXLOVID)TABLET: 3.0000 | ORAL_TABLET | Freq: Two times a day (BID) | ORAL | 0 refills | Status: AC

## 2020-11-27 NOTE — Progress Notes (Signed)
Telemedicine Encounter- SOAP NOTE Established Patient  This telephone encounter was conducted with the patient's (or proxy's) verbal consent via audio telecommunications: yes/no: Yes Patient was instructed to have this encounter in a suitably private space; and to only have persons present to whom they give permission to participate. In addition, patient identity was confirmed by use of name plus two identifiers (DOB and address).  I discussed the limitations, risks, security and privacy concerns of performing an evaluation and management service by telephone and the availability of in person appointments. I also discussed with the patient that there may be a patient responsible charge related to this service. The patient expressed understanding and agreed to proceed.  I spent a total of TIME; 0 MIN TO 60 MIN: 15 minutes talking with the patient or their proxy.  Patient at home Provider in office  Participants: Jari Sportsman, NP and Terrace Arabia  Chief Complaint  Patient presents with   Covid Positive    Cough,body aches,congestion,fever,chills,sorethroat    Subjective   Alexander Schroeder is a 55 y.o. established patient. Telephone visit today for COVID+  HPI COVID positive - positive test on Wednesday, confirmed with test yesterday Slight cough on Monday Progressed to body aches, chills, fever, sore throat, congestion by Wednesday/Thursday Stable since. No trouble breathing  No sick contacts that he is aware of   Vaccinated and boosted x 1  Patient Active Problem List   Diagnosis Date Noted   Overweight (BMI 25.0-29.9) 10/07/2020   Hyperlipidemia 10/07/2020   Elevated BP without diagnosis of hypertension 10/07/2020   Anxiety 05/25/2012   Insomnia 05/25/2012   KNEE PAIN, RIGHT 11/02/2009    Past Medical History:  Diagnosis Date   Anal fissure    Anxiety    Elevated PSA     Current Outpatient Medications  Medication Sig Dispense Refill   citalopram (CELEXA) 20 MG  tablet TAKE 1 TABLET ONCE DAILY. 90 tablet 1   FEROSUL 325 (65 Fe) MG tablet TAKE 1 TABLET EVERY DAY WITH BREAKFAST. 90 tablet 2   fluticasone (FLONASE) 50 MCG/ACT nasal spray Place 2 sprays into both nostrils daily. 16 g 6   COVID-19 mRNA bivalent vaccine, Pfizer, (PFIZER COVID-19 VAC BIVALENT) injection Inject into the muscle. 0.3 mL 0   No current facility-administered medications for this visit.    No Known Allergies  Social History   Socioeconomic History   Marital status: Married    Spouse name: Not on file   Number of children: Not on file   Years of education: Not on file   Highest education level: Not on file  Occupational History   Not on file  Tobacco Use   Smoking status: Never   Smokeless tobacco: Never  Vaping Use   Vaping Use: Never used  Substance and Sexual Activity   Alcohol use: No   Drug use: No   Sexual activity: Not on file  Other Topics Concern   Not on file  Social History Narrative   Lives with wife and 2 kids (1998, 70).         Social Determinants of Health   Financial Resource Strain: Not on file  Food Insecurity: Not on file  Transportation Needs: Not on file  Physical Activity: Not on file  Stress: Not on file  Social Connections: Not on file  Intimate Partner Violence: Not on file    Review of Systems  Constitutional: Negative.   HENT: Negative.    Eyes: Negative.   Respiratory: Negative.  Cardiovascular: Negative.   Gastrointestinal: Negative.   Genitourinary: Negative.   Musculoskeletal: Negative.   Skin: Negative.   Neurological: Negative.   Endo/Heme/Allergies: Negative.   Psychiatric/Behavioral: Negative.    All other systems reviewed and are negative.  Objective   Vitals as reported by the patient: There were no vitals filed for this visit.  [name] was seen today for covid positive.  Diagnoses and all orders for this visit:  COVID-19   PLAN Discussed treatment options- antivirals vs conservative  therapy. Pt opts for conservative therapy. Reviewed isolation guidelines with patient who voices understanding Return and ER precautions reviewed with patient Patient encouraged to call clinic with any questions, comments, or concerns.  I discussed the assessment and treatment plan with the patient. The patient was provided an opportunity to ask questions and all were answered. The patient agreed with the plan and demonstrated an understanding of the instructions.   The patient was advised to call back or seek an in-person evaluation if the symptoms worsen or if the condition fails to improve as anticipated.  I provided 15 minutes of non-face-to-face time during this encounter.  Janeece Agee, NP

## 2020-11-27 NOTE — Telephone Encounter (Signed)
After hours call came in that pt was seen this afternoon for 3 days of COVID sxs and was told that Paxlovid would be sent to his pharmacy.  This medication was not sent and pt knows he has to start the medication within 5 days and even though his pharmacy is now closed, he is hoping to get it first thing in the morning.  Prescription was sent

## 2020-12-01 ENCOUNTER — Telehealth: Payer: Self-pay | Admitting: Family Medicine

## 2020-12-01 NOTE — Telephone Encounter (Signed)
Patient states he is having panic attacks and thinks it from the anti viral medication - please advise

## 2020-12-01 NOTE — Telephone Encounter (Signed)
Called patient with pcp recommendations. Patient voiced understanding. 

## 2020-12-01 NOTE — Telephone Encounter (Signed)
He can certainly stop the anti-viral medication.  He doesn't need to complete the course of meds.  I would advise him to stop and see if sxs improve.

## 2020-12-09 ENCOUNTER — Encounter: Payer: Self-pay | Admitting: *Deleted

## 2021-03-19 ENCOUNTER — Other Ambulatory Visit: Payer: Self-pay

## 2021-03-19 ENCOUNTER — Other Ambulatory Visit (HOSPITAL_BASED_OUTPATIENT_CLINIC_OR_DEPARTMENT_OTHER): Payer: Self-pay

## 2021-03-19 ENCOUNTER — Ambulatory Visit: Payer: BC Managed Care – PPO | Attending: Internal Medicine

## 2021-03-19 DIAGNOSIS — Z23 Encounter for immunization: Secondary | ICD-10-CM

## 2021-03-19 MED ORDER — PFIZER COVID-19 VAC BIVALENT 30 MCG/0.3ML IM SUSP
INTRAMUSCULAR | 0 refills | Status: DC
Start: 2021-03-19 — End: 2021-08-27
  Filled 2021-03-19: qty 0.3, 1d supply, fill #0

## 2021-03-19 NOTE — Progress Notes (Signed)
   Covid-19 Vaccination Clinic  Name:  Alexander Schroeder    MRN: [id number] DOB: 1965/09/04  03/19/2021  Mr. Alexander Schroeder was observed post Covid-19 immunization for 15 minutes without incident. He was provided with Vaccine Information Sheet and instruction to access the V-Safe system.   Mr. Alexander Schroeder was instructed to call 911 with any severe reactions post vaccine: Difficulty breathing  Swelling of face and throat  A fast heartbeat  A bad rash all over body  Dizziness and weakness

## 2021-04-08 ENCOUNTER — Encounter: Payer: BC Managed Care – PPO | Admitting: Family Medicine

## 2021-04-15 ENCOUNTER — Ambulatory Visit (INDEPENDENT_AMBULATORY_CARE_PROVIDER_SITE_OTHER): Payer: BC Managed Care – PPO | Admitting: Family Medicine

## 2021-04-15 ENCOUNTER — Other Ambulatory Visit: Payer: Self-pay

## 2021-04-15 ENCOUNTER — Encounter: Payer: Self-pay | Admitting: Family Medicine

## 2021-04-15 VITALS — BP 120/80 | HR 58 | Temp 98.1°F | Resp 18 | Ht 65.1 in | Wt 168.8 lb

## 2021-04-15 DIAGNOSIS — E663 Overweight: Secondary | ICD-10-CM

## 2021-04-15 DIAGNOSIS — Z114 Encounter for screening for human immunodeficiency virus [HIV]: Secondary | ICD-10-CM

## 2021-04-15 DIAGNOSIS — Z0000 Encounter for general adult medical examination without abnormal findings: Secondary | ICD-10-CM | POA: Diagnosis not present

## 2021-04-15 DIAGNOSIS — Z125 Encounter for screening for malignant neoplasm of prostate: Secondary | ICD-10-CM

## 2021-04-15 DIAGNOSIS — Z1159 Encounter for screening for other viral diseases: Secondary | ICD-10-CM

## 2021-04-15 DIAGNOSIS — Z23 Encounter for immunization: Secondary | ICD-10-CM | POA: Diagnosis not present

## 2021-04-15 LAB — LIPID PANEL
Cholesterol: 182 mg/dL (ref 0–200)
HDL: 42.7 mg/dL (ref >39.00)
LDL Cholesterol: 118 mg/dL — ABNORMAL HIGH (ref 0–99)
NonHDL: 139.25
Total CHOL/HDL Ratio: 4
Triglycerides: 104 mg/dL (ref 0.0–149.0)
VLDL: 20.8 mg/dL (ref 0.0–40.0)

## 2021-04-15 LAB — BASIC METABOLIC PANEL
BUN: 19 mg/dL (ref 6–23)
CO2: 29 mEq/L (ref 19–32)
Calcium: 9.2 mg/dL (ref 8.4–10.5)
Chloride: 103 mEq/L (ref 96–112)
Creatinine, Ser: 1.19 mg/dL (ref 0.40–1.50)
GFR: 67.68 mL/min (ref >60.00)
Glucose, Bld: 101 mg/dL — ABNORMAL HIGH (ref 70–99)
Potassium: 4.9 mEq/L (ref 3.5–5.1)
Sodium: 138 mEq/L (ref 135–145)

## 2021-04-15 LAB — HEPATIC FUNCTION PANEL
ALT: 16 U/L (ref 0–53)
AST: 18 U/L (ref 0–37)
Albumin: 4.2 g/dL (ref 3.5–5.2)
Alkaline Phosphatase: 102 U/L (ref 39–117)
Bilirubin, Direct: 0.1 mg/dL (ref 0.0–0.3)
Total Bilirubin: 0.9 mg/dL (ref 0.2–1.2)
Total Protein: 6.9 g/dL (ref 6.0–8.3)

## 2021-04-15 LAB — CBC WITH DIFFERENTIAL/PLATELET
Basophils Absolute: 0 10*3/uL (ref 0.0–0.1)
Basophils Relative: 0.9 % (ref 0.0–3.0)
Eosinophils Absolute: 0.1 10*3/uL (ref 0.0–0.7)
Eosinophils Relative: 2.6 % (ref 0.0–5.0)
HCT: 44.4 % (ref 39.0–52.0)
Hemoglobin: 14.5 g/dL (ref 13.0–17.0)
Lymphocytes Relative: 23.8 % (ref 12.0–46.0)
Lymphs Abs: 0.9 10*3/uL (ref 0.7–4.0)
MCHC: 32.7 g/dL (ref 30.0–36.0)
MCV: 82.6 fl (ref 78.0–100.0)
Monocytes Absolute: 0.3 10*3/uL (ref 0.1–1.0)
Monocytes Relative: 8.8 % (ref 3.0–12.0)
Neutro Abs: 2.3 10*3/uL (ref 1.4–7.7)
Neutrophils Relative %: 63.9 % (ref 43.0–77.0)
Platelets: 190 10*3/uL (ref 150.0–400.0)
RBC: 5.37 Mil/uL (ref 4.22–5.81)
RDW: 14.9 % (ref 11.5–15.5)
WBC: 3.6 10*3/uL — ABNORMAL LOW (ref 4.0–10.5)

## 2021-04-15 LAB — PSA: PSA: 1.2 ng/mL (ref 0.10–4.00)

## 2021-04-15 LAB — TSH: TSH: 1.73 u[IU]/mL (ref 0.35–5.50)

## 2021-04-15 NOTE — Assessment & Plan Note (Signed)
Pt's BMI is 28.  He is exercising regularly and looks great.  Check labs to risk stratify.  Will follow.

## 2021-04-15 NOTE — Assessment & Plan Note (Signed)
Pt's PE WNL.  UTD on colonoscopy, Tdap.  Check labs.  Anticipatory guidance provided.  

## 2021-04-15 NOTE — Patient Instructions (Signed)
Follow up in 1 year or as needed We'll notify you of your lab results and make any changes if needed Keep up the good work on healthy diet and regular exercise- you look great! Call with any questions or concerns Stay Safe! Stay Healthy! Happy Holidays!!! 

## 2021-04-15 NOTE — Progress Notes (Signed)
   Subjective:    Patient ID: Alexander Schroeder, male    DOB: 11-27-1965, 55 y.o.   MRN: [id number]  HPI CPE- UTD on colonoscopy, Tdap.  Will get flu shot today.  Patient Care Team    Relationship Specialty Notifications Start End  Sheliah Hatch, MD PCP - General   05/26/10   Hart Carwin, MD (Inactive)  Gastroenterology  02/12/15     Health Maintenance  Topic Date Due   HIV Screening  Never done   Hepatitis C Screening  Never done   Zoster Vaccines- Shingrix (1 of 2) Never done   INFLUENZA VACCINE  01/11/2021   TETANUS/TDAP  01/10/2022   COLONOSCOPY (Pts 45-39yrs Insurance coverage will need to be confirmed)  04/11/2024   COVID-19 Vaccine  Completed   Pneumococcal Vaccine 56-33 Years old  Aged Out   HPV VACCINES  Aged Out      Review of Systems Patient reports no vision/hearing changes, anorexia, fever ,adenopathy, persistant/recurrent hoarseness, swallowing issues, chest pain, palpitations, edema, persistant/recurrent cough, hemoptysis, dyspnea (rest,exertional, paroxysmal nocturnal), gastrointestinal  bleeding (melena, rectal bleeding), abdominal pain, excessive heart burn, GU symptoms (dysuria, hematuria, voiding/incontinence issues) syncope, focal weakness, memory loss, numbness & tingling, skin/hair/nail changes, depression, anxiety, abnormal bruising/bleeding.   + knee problems- 'feels like they're going to give out' when running.  Training for 1/2 marathon  This visit occurred during the SARS-CoV-2 public health emergency.  Safety protocols were in place, including screening questions prior to the visit, additional usage of staff PPE, and extensive cleaning of exam room while observing appropriate contact time as indicated for disinfecting solutions.      Objective:   Physical Exam General Appearance:    Alert, cooperative, no distress, appears stated age  Head:    Normocephalic, without obvious abnormality, atraumatic  Eyes:    PERRL, conjunctiva/corneas clear, EOM's  intact, fundi    benign, both eyes       Ears:    Normal TM's and external ear canals, both ears  Nose:   Deferred due to COVID  Throat:   Neck:   Supple, symmetrical, trachea midline, no adenopathy;       thyroid:  No enlargement/tenderness/nodules  Back:     Symmetric, no curvature, ROM normal, no CVA tenderness  Lungs:     Clear to auscultation bilaterally, respirations unlabored  Chest wall:    No tenderness or deformity  Heart:    Regular rate and rhythm, S1 and S2 normal, no murmur, rub   or gallop  Abdomen:     Soft, non-tender, bowel sounds active all four quadrants,    no masses, no organomegaly  Genitalia:    Deferred   Rectal:    Extremities:   Extremities normal, atraumatic, no cyanosis or edema  Pulses:   2+ and symmetric all extremities  Skin:   Skin color, texture, turgor normal, no rashes or lesions  Lymph nodes:   Cervical, supraclavicular, and axillary nodes normal  Neurologic:   CNII-XII intact. Normal strength, sensation and reflexes      throughout          Assessment & Plan:

## 2021-04-16 LAB — HIV ANTIBODY (ROUTINE TESTING W REFLEX): HIV 1&2 Ab, 4th Generation: NONREACTIVE

## 2021-04-16 LAB — HEPATITIS C ANTIBODY
Hepatitis C Ab: NONREACTIVE
SIGNAL TO CUT-OFF: 0.07 (ref <1.00)

## 2021-06-11 ENCOUNTER — Ambulatory Visit (INDEPENDENT_AMBULATORY_CARE_PROVIDER_SITE_OTHER): Payer: BC Managed Care – PPO | Admitting: Family Medicine

## 2021-06-11 DIAGNOSIS — Z23 Encounter for immunization: Secondary | ICD-10-CM | POA: Diagnosis not present

## 2021-06-14 ENCOUNTER — Other Ambulatory Visit: Payer: Self-pay | Admitting: Family Medicine

## 2021-06-16 NOTE — Progress Notes (Signed)
Patient was here for a nurse visit to receive a Shingles Vaccination.

## 2021-08-25 ENCOUNTER — Observation Stay (HOSPITAL_BASED_OUTPATIENT_CLINIC_OR_DEPARTMENT_OTHER): Payer: BC Managed Care – PPO

## 2021-08-25 ENCOUNTER — Encounter (HOSPITAL_COMMUNITY): Payer: Self-pay | Admitting: *Deleted

## 2021-08-25 ENCOUNTER — Other Ambulatory Visit: Payer: Self-pay

## 2021-08-25 ENCOUNTER — Emergency Department (HOSPITAL_COMMUNITY): Payer: BC Managed Care – PPO

## 2021-08-25 ENCOUNTER — Observation Stay (HOSPITAL_COMMUNITY)
Admission: EM | Admit: 2021-08-25 | Discharge: 2021-08-27 | Disposition: A | Discharge status: Home / Self Care | Payer: BC Managed Care – PPO | Attending: Emergency Medicine | Admitting: Emergency Medicine

## 2021-08-25 DIAGNOSIS — N179 Acute kidney failure, unspecified: Secondary | ICD-10-CM | POA: Diagnosis not present

## 2021-08-25 DIAGNOSIS — W1830XA Fall on same level, unspecified, initial encounter: Secondary | ICD-10-CM | POA: Diagnosis not present

## 2021-08-25 DIAGNOSIS — R55 Syncope and collapse: Secondary | ICD-10-CM | POA: Diagnosis not present

## 2021-08-25 DIAGNOSIS — M25561 Pain in right knee: Secondary | ICD-10-CM

## 2021-08-25 DIAGNOSIS — S060X1A Concussion with loss of consciousness of 30 minutes or less, initial encounter: Secondary | ICD-10-CM

## 2021-08-25 DIAGNOSIS — Z20822 Contact with and (suspected) exposure to covid-19: Secondary | ICD-10-CM | POA: Diagnosis not present

## 2021-08-25 DIAGNOSIS — Y9302 Activity, running: Secondary | ICD-10-CM | POA: Diagnosis not present

## 2021-08-25 LAB — CBC WITH DIFFERENTIAL/PLATELET
Abs Immature Granulocytes: 0.02 10*3/uL (ref 0.00–0.07)
Basophils Absolute: 0 10*3/uL (ref 0.0–0.1)
Basophils Relative: 1 %
Eosinophils Absolute: 0 10*3/uL (ref 0.0–0.5)
Eosinophils Relative: 1 %
HCT: 46.9 % (ref 39.0–52.0)
Hemoglobin: 15.7 g/dL (ref 13.0–17.0)
Immature Granulocytes: 0 %
Lymphocytes Relative: 22 %
Lymphs Abs: 1.2 10*3/uL (ref 0.7–4.0)
MCH: 27.1 pg (ref 26.0–34.0)
MCHC: 33.5 g/dL (ref 30.0–36.0)
MCV: 80.9 fL (ref 80.0–100.0)
Monocytes Absolute: 0.4 10*3/uL (ref 0.1–1.0)
Monocytes Relative: 7 %
Neutro Abs: 4 10*3/uL (ref 1.7–7.7)
Neutrophils Relative %: 69 %
Platelets: 259 10*3/uL (ref 150–400)
RBC: 5.8 MIL/uL (ref 4.22–5.81)
RDW: 13.7 % (ref 11.5–15.5)
WBC: 5.7 10*3/uL (ref 4.0–10.5)
nRBC: 0 % (ref 0.0–0.2)

## 2021-08-25 LAB — COMPREHENSIVE METABOLIC PANEL
ALT: 20 U/L (ref 0–44)
AST: 25 U/L (ref 15–41)
Albumin: 4.2 g/dL (ref 3.5–5.0)
Alkaline Phosphatase: 102 U/L (ref 38–126)
Anion gap: 15 (ref 5–15)
BUN: 16 mg/dL (ref 6–20)
CO2: 21 mmol/L — ABNORMAL LOW (ref 22–32)
Calcium: 9.5 mg/dL (ref 8.9–10.3)
Chloride: 100 mmol/L (ref 98–111)
Creatinine, Ser: 1.33 mg/dL — ABNORMAL HIGH (ref 0.61–1.24)
GFR, Estimated: >60 mL/min (ref >60)
Glucose, Bld: 105 mg/dL — ABNORMAL HIGH (ref 70–99)
Potassium: 4.3 mmol/L (ref 3.5–5.1)
Sodium: 136 mmol/L (ref 135–145)
Total Bilirubin: 1.3 mg/dL — ABNORMAL HIGH (ref 0.3–1.2)
Total Protein: 7.5 g/dL (ref 6.5–8.1)

## 2021-08-25 LAB — TROPONIN I (HIGH SENSITIVITY)
Troponin I (High Sensitivity): 4 ng/L (ref <18)
Troponin I (High Sensitivity): 5 ng/L (ref <18)

## 2021-08-25 LAB — ECHOCARDIOGRAM COMPLETE
Area-P 1/2: 2.99 cm2
Height: 69 in
S' Lateral: 2.3 cm
Weight: 2800 oz

## 2021-08-25 LAB — URINALYSIS, ROUTINE W REFLEX MICROSCOPIC
Bilirubin Urine: NEGATIVE
Glucose, UA: NEGATIVE mg/dL
Ketones, ur: 80 mg/dL — AB
Leukocytes,Ua: NEGATIVE
Nitrite: NEGATIVE
Protein, ur: NEGATIVE mg/dL
Specific Gravity, Urine: 1.017 (ref 1.005–1.030)
pH: 7 (ref 5.0–8.0)

## 2021-08-25 LAB — RESP PANEL BY RT-PCR (FLU A&B, COVID) ARPGX2
Influenza A by PCR: NEGATIVE
Influenza B by PCR: NEGATIVE
SARS Coronavirus 2 by RT PCR: NEGATIVE

## 2021-08-25 LAB — CBG MONITORING, ED: Glucose-Capillary: 89 mg/dL (ref 70–99)

## 2021-08-25 IMAGING — CT CT HEAD W/O CM
4 series · 16 of 47 positions shown, 18 images · non-contrast
Comparison: CT head [DATE].

CLINICAL DATA: Head trauma, abnormal mental status (Age 18-64y)



[Series 3: head without · axial · non-contrast · 0.46mm/px · z∈[-204,-69]mm · 7 of 37 slices shown, 9 images]
[im 5/37  brain]
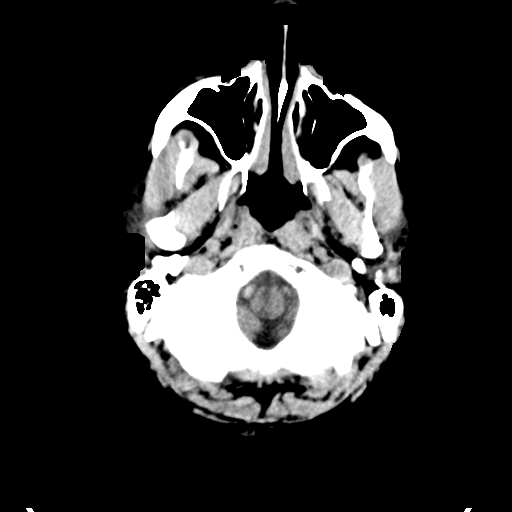
[im 5/37  bone]
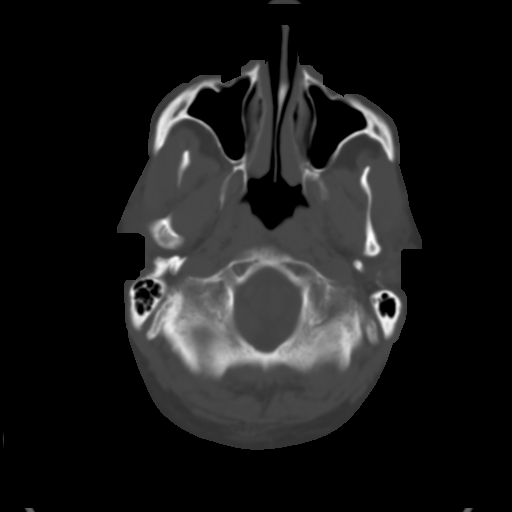
[im 10/37  brain]
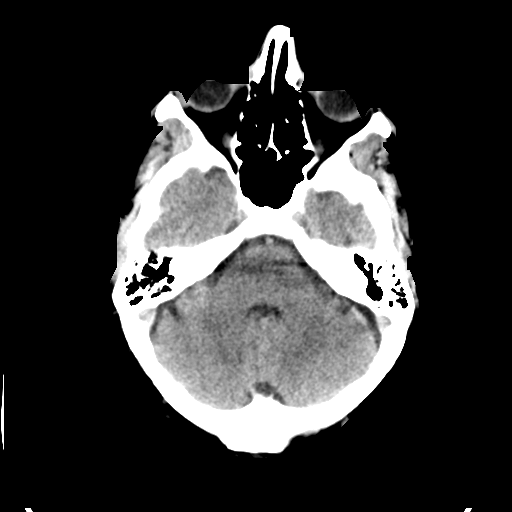
[im 14/37  brain]
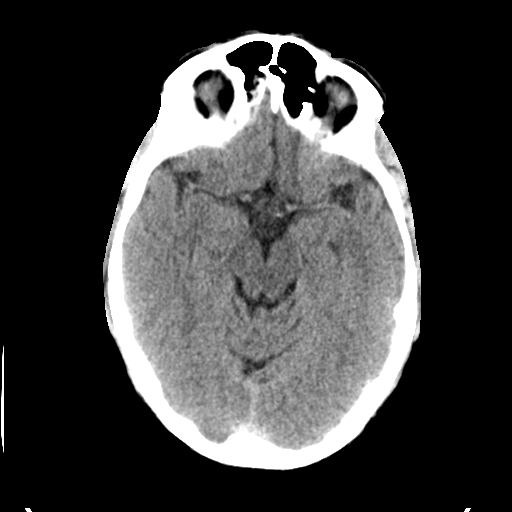
[im 19/37  brain]
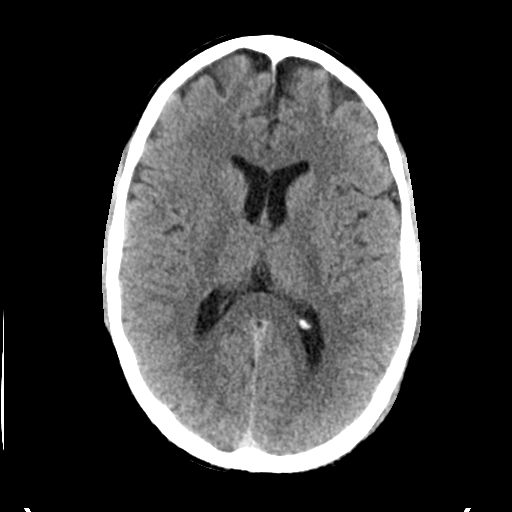
[im 23/37  brain]
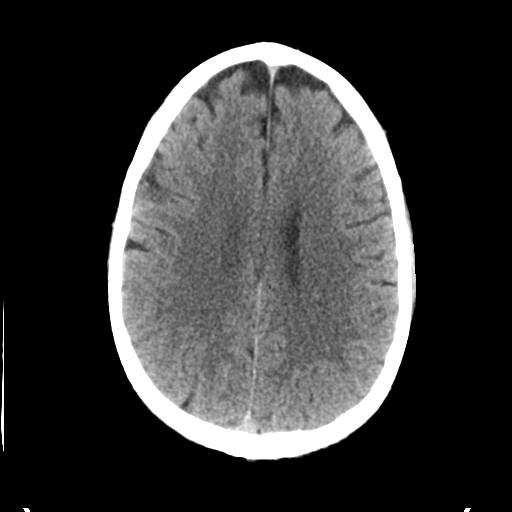
[im 23/37  bone]
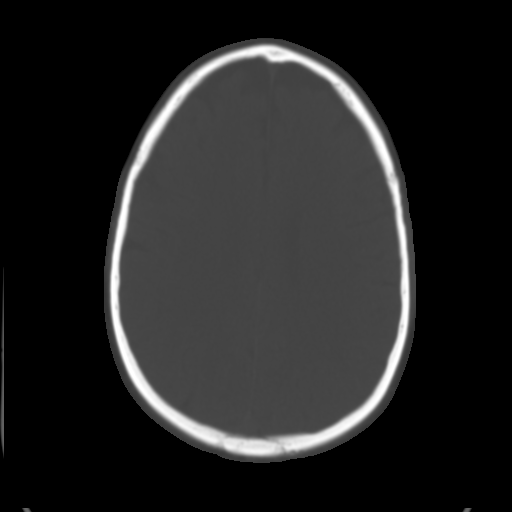
[im 28/37  brain]
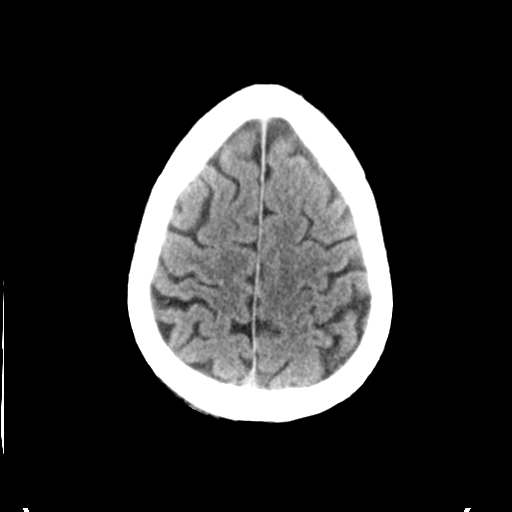
[im 32/37  brain]
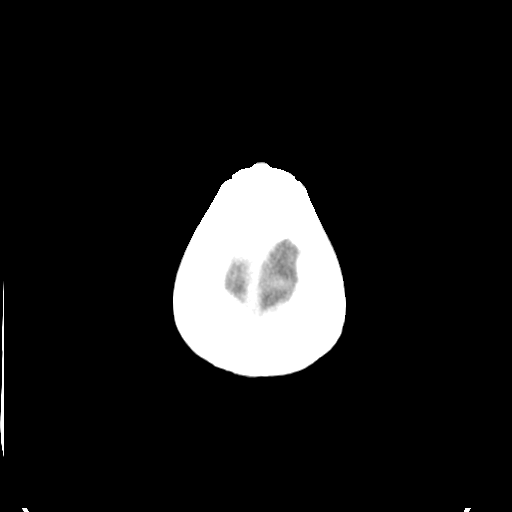

[Series 4: ax head bone · axial · 0.46mm/px · z∈[-204,-168]mm · 3 of 91 slices shown]
[im 10/91  bone]
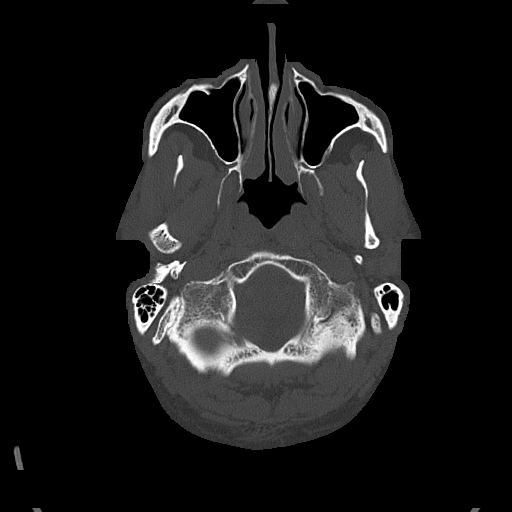
[im 19/91  bone]
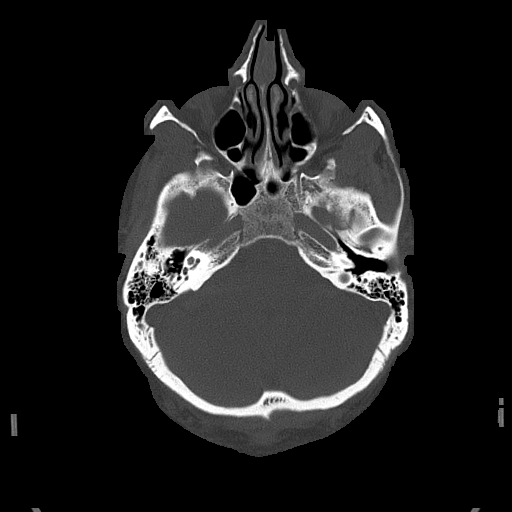
[im 28/91  bone]
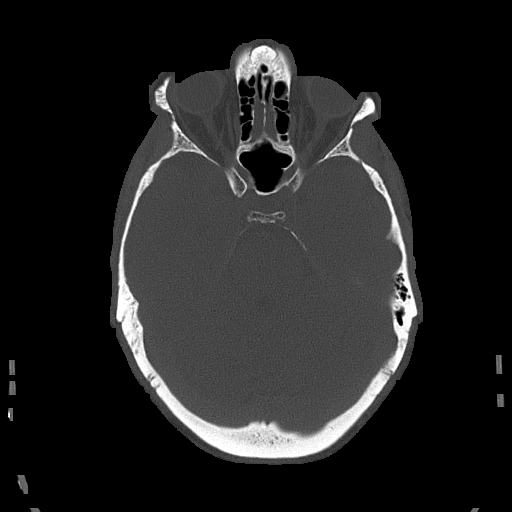

[Series 5: head without cor · coronal · non-contrast · 0.36mm/px · 3 of 83 slices shown]
[im 28/83  brain]
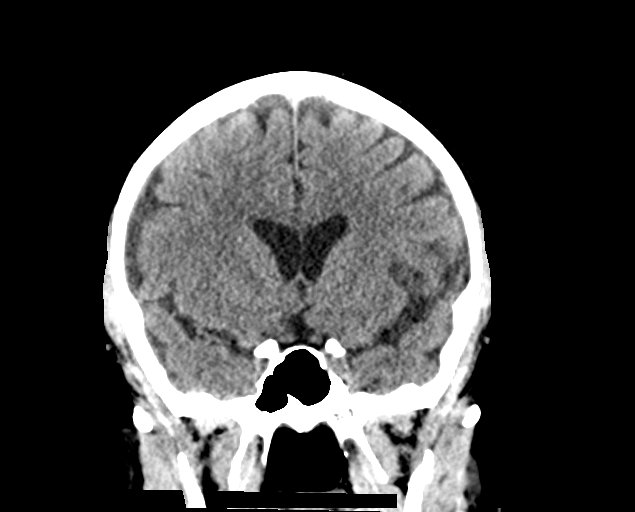
[im 37/83  brain]
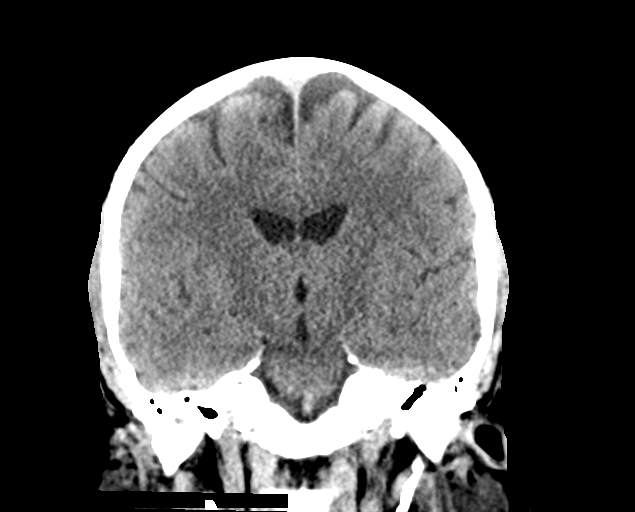
[im 46/83  brain]
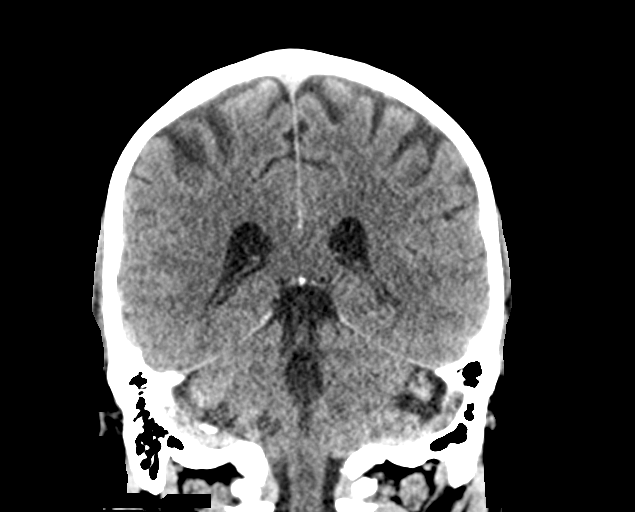

[Series 6: head without sag · sagittal · non-contrast · 0.36mm/px · 3 of 65 slices shown]
[im 22/65  brain]
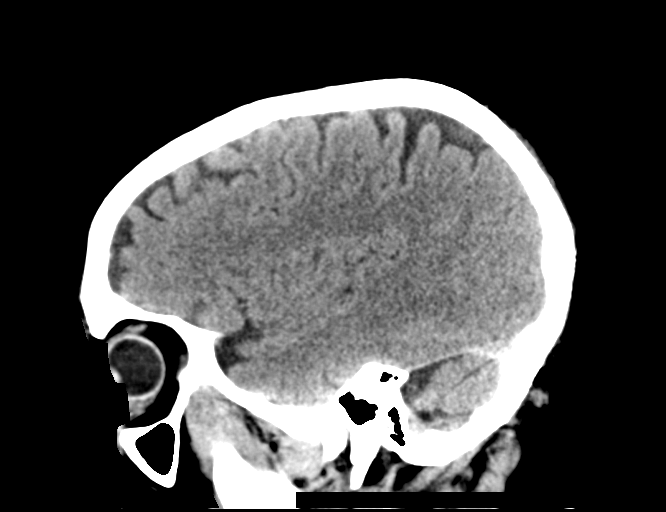
[im 33/65  brain]
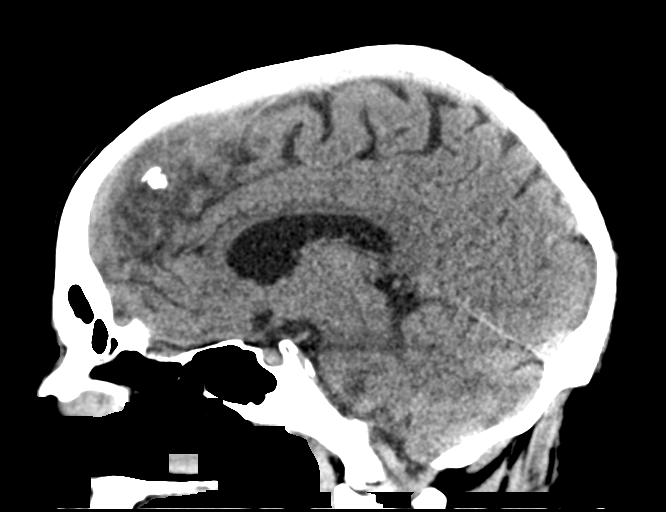
[im 43/65  brain]
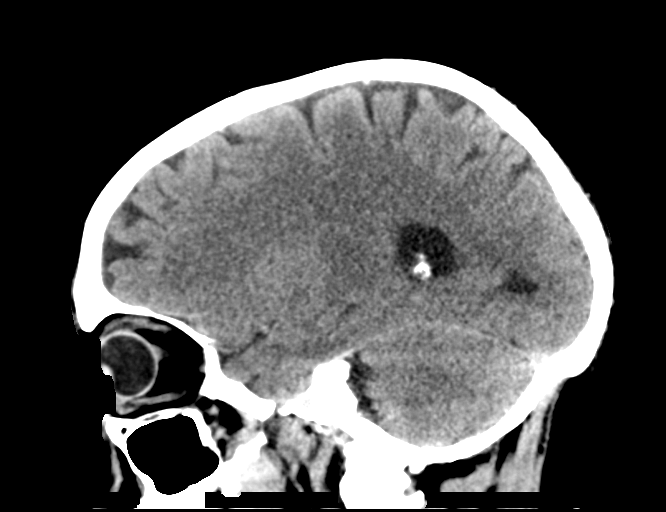

[16 of 47 positions shown; findings below may reference images not displayed]

FINDINGS: Brain: No evidence of acute infarction, hemorrhage, hydrocephalus,
extra-axial collection or mass lesion/mass effect.

Vascular: No hyperdense vessel identified.

Skull: No acute fracture.

Sinuses/Orbits: Clear sinuses.  Unremarkable orbits.

Other: No mastoid effusions.
IMPRESSION: No evidence of acute intracranial abnormality.

## 2021-08-25 IMAGING — CR DG KNEE COMPLETE 4+V*R*
4 series · 4 of 4 positions shown · non-contrast
Comparison: None

CLINICAL DATA: Right knee pain post fall today

EXAM:
RIGHT KNEE - COMPLETE 4+ VIEW

[knee ap]
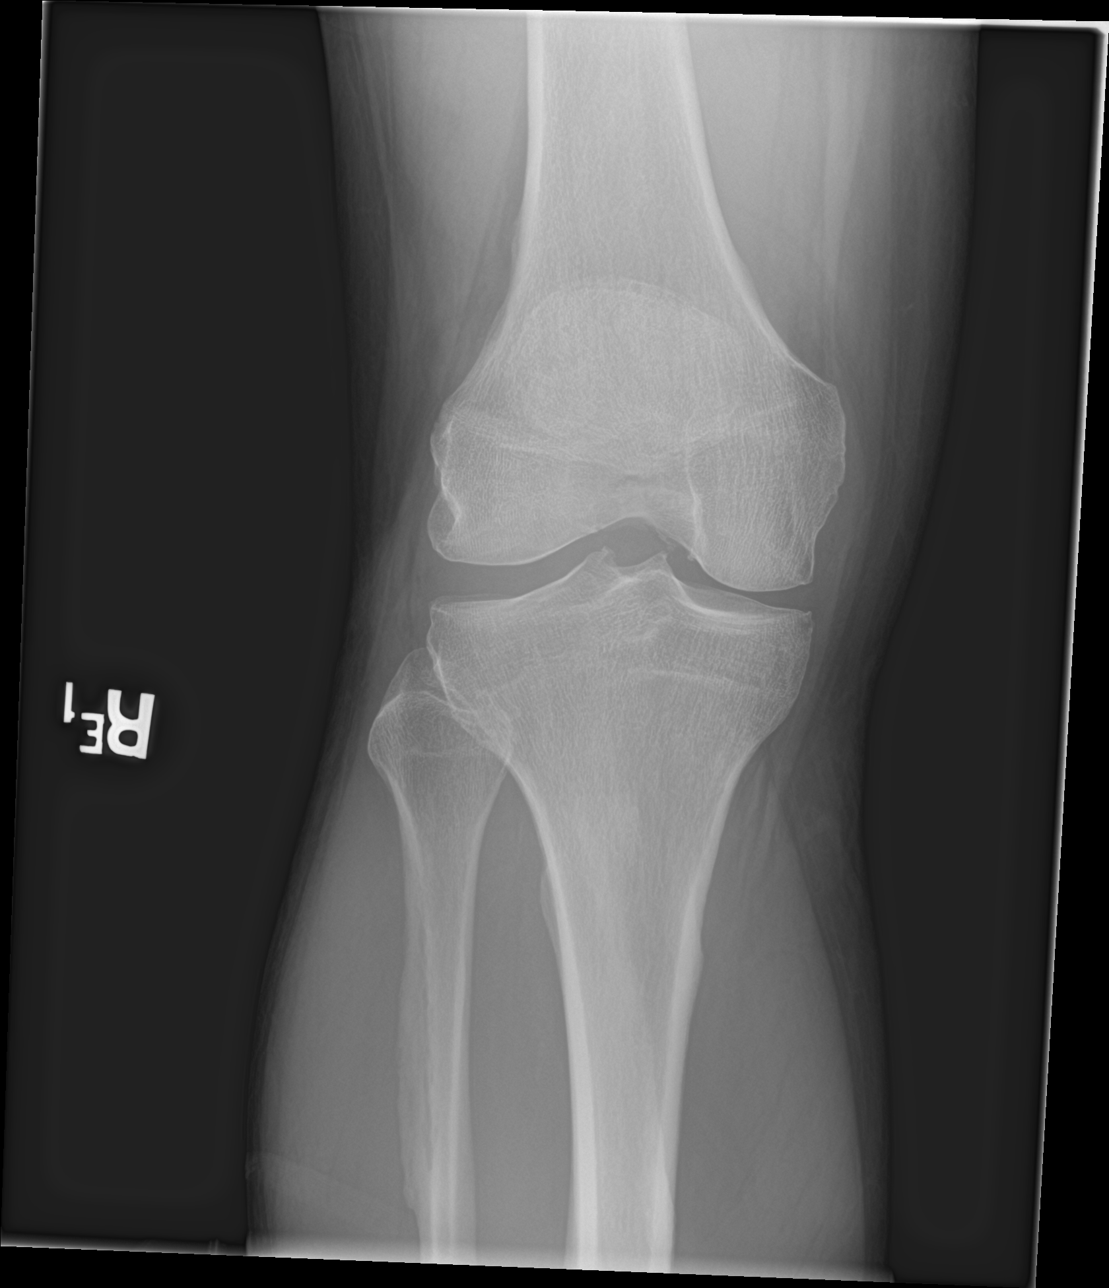

[knee lat]
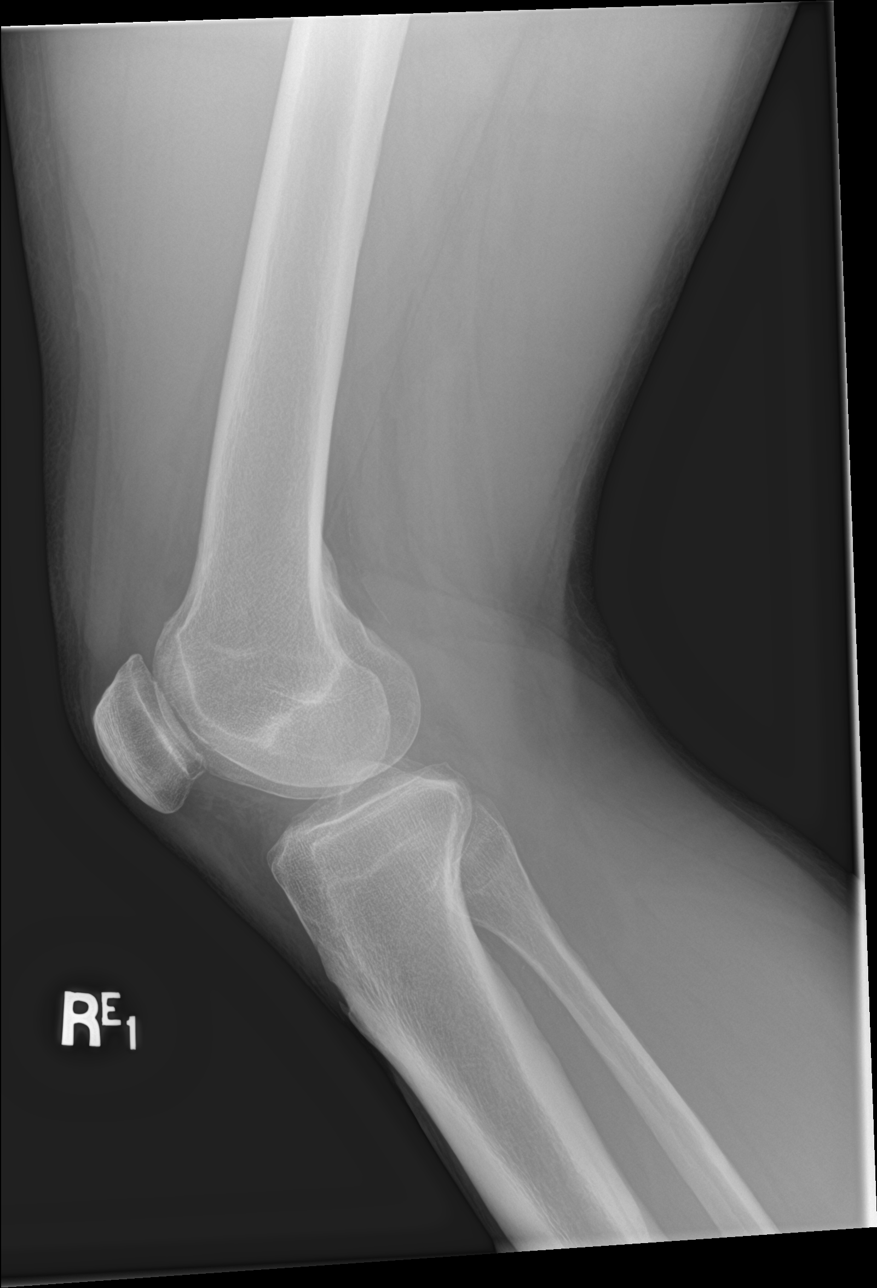

[knee obl (1 of 2)]
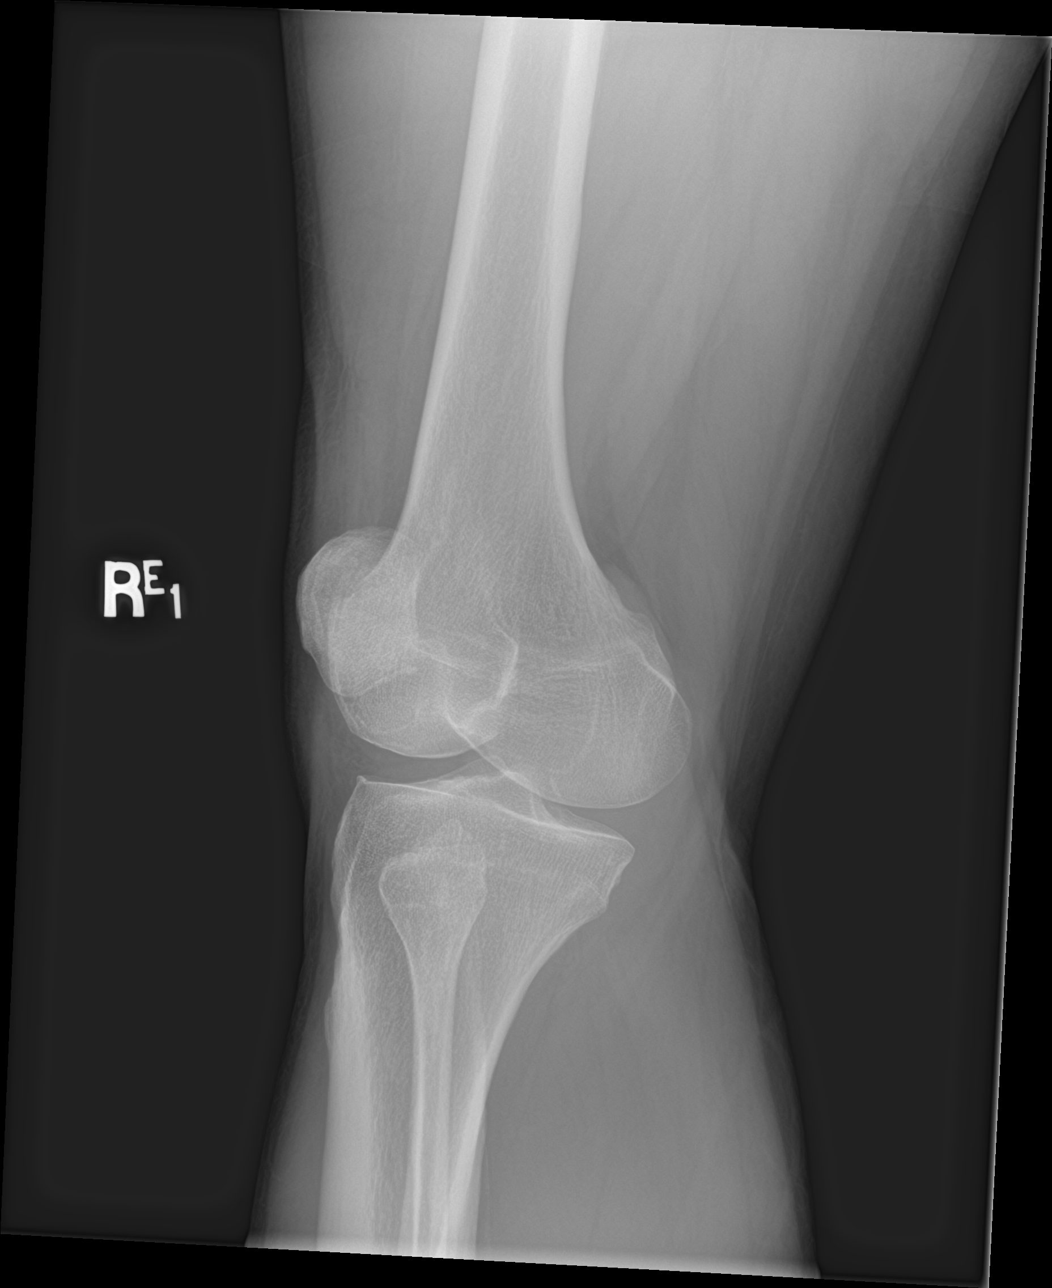

[knee obl (2 of 2)]
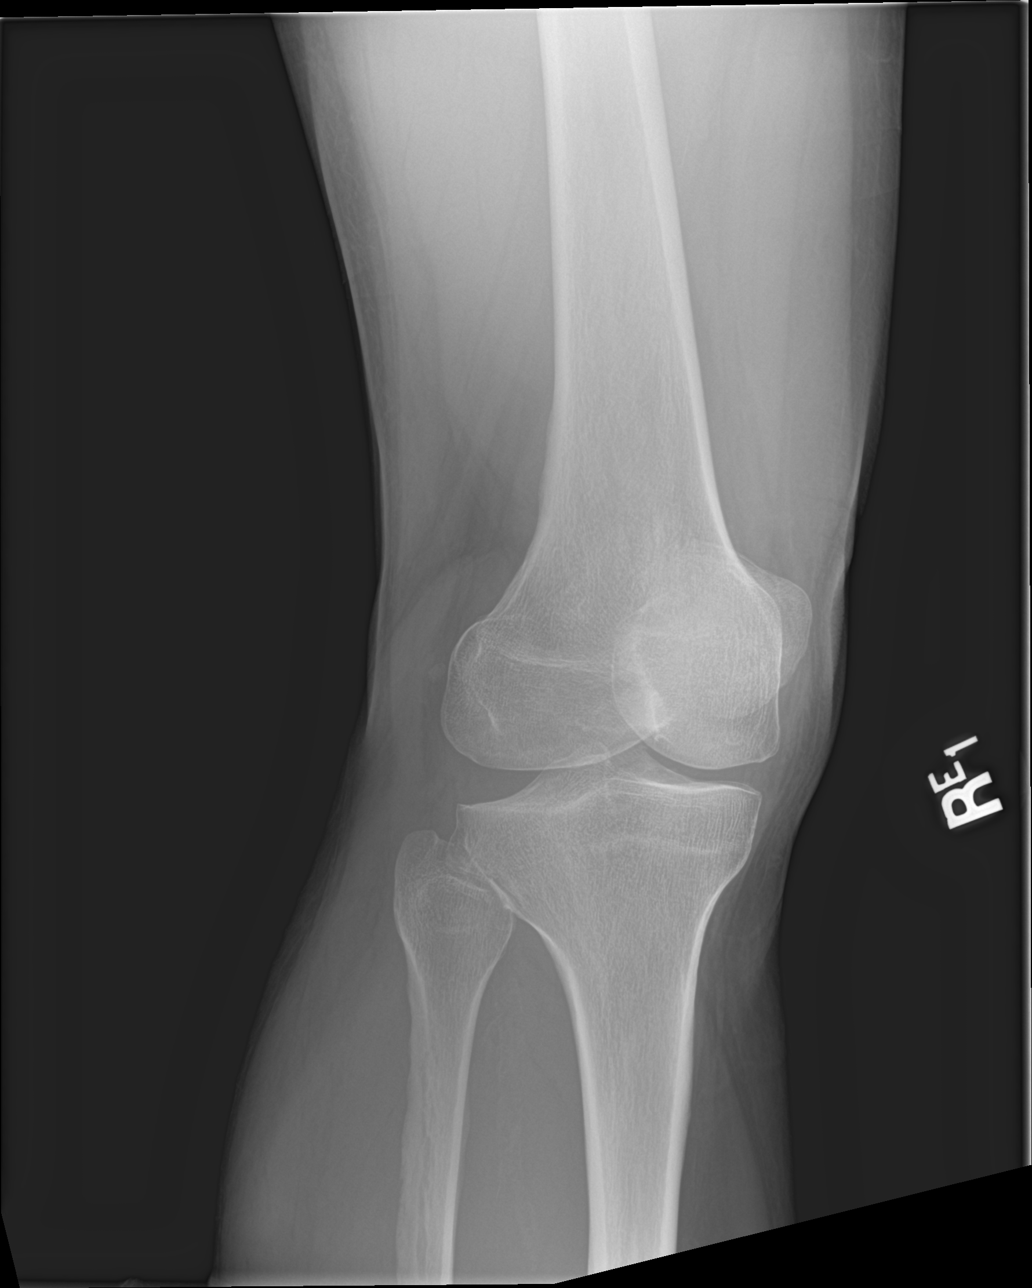

[4 of 4 positions shown; findings below may reference images not displayed]

FINDINGS: Osseous mineralization normal.

Mild scattered joint space narrowing.

No acute fracture, dislocation, or bone destruction.

No definite knee joint effusion.
IMPRESSION: No acute abnormalities.

Probable mild degenerative changes.

## 2021-08-25 MED ORDER — SENNOSIDES-DOCUSATE SODIUM 8.6-50 MG PO TABS: 1.0000 | ORAL_TABLET | Freq: Every evening | ORAL | Status: DC | PRN

## 2021-08-25 MED ORDER — LACTATED RINGERS IV SOLN
INTRAVENOUS | Status: DC
Start: 2021-08-25 — End: 2021-08-27

## 2021-08-25 MED ORDER — SODIUM CHLORIDE 0.9% FLUSH: 3.0000 mL | Freq: Two times a day (BID) | INTRAVENOUS | Status: DC

## 2021-08-25 MED ORDER — ONDANSETRON HCL 4 MG/2ML IJ SOLN
4.0000 mg | Freq: Three times a day (TID) | INTRAMUSCULAR | Status: DC | PRN
  Administered 2021-08-25 – 2021-08-26 (×2): 4 mg via INTRAVENOUS
  Filled 2021-08-25 (×2): qty 2

## 2021-08-25 MED ORDER — ONDANSETRON HCL 4 MG/2ML IJ SOLN
4.0000 mg | Freq: Once | INTRAMUSCULAR | Status: AC
  Administered 2021-08-25: 4 mg via INTRAVENOUS
  Filled 2021-08-25: qty 2

## 2021-08-25 MED ORDER — LACTATED RINGERS IV SOLN: INTRAVENOUS | Status: DC

## 2021-08-25 MED ORDER — ENOXAPARIN SODIUM 40 MG/0.4ML IJ SOSY
40.0000 mg | PREFILLED_SYRINGE | INTRAMUSCULAR | Status: DC
  Administered 2021-08-26: 40 mg via SUBCUTANEOUS
  Filled 2021-08-25: qty 0.4

## 2021-08-25 NOTE — ED Notes (Signed)
Report handed off to [facility] via secure chat at this time. Patient will be placed in transport.  ?

## 2021-08-25 NOTE — Progress Notes (Signed)
?  Echocardiogram ?2D Echocardiogram has been performed. ? ?Alexander Schroeder ?08/25/2021, 5:45 PM ?

## 2021-08-25 NOTE — H&P (Signed)
History and Physical    Alexander Schroeder WUJ:[id number] DOB: 1965-11-14 DOA: 08/25/2021  PCP: Sheliah Hatch, MD  Patient coming from: home   Chief Complaint: syncope, Rt knee pop/pain  HPI: Alexander Schroeder is a 56 y.o. male with medical history significant of Anxiety , iron deficiency due to donating blood in the past presents after having syncopal episodes .  Patient went running during his lunchtime at 12, had not eaten breakfast but had 24 ounces of water prior to running and he heard a pop of his right knee and was unable to run much . Then as he started to walk back to his office, he apparently passed out, as the next event he remembers is waking up in the ambulance. Pt apple watch recognized the fall and called 911. Patient c/o HA. Denies any prodromes prior to syncope.  Denies any shortness of breath, angina or anginal-like symptoms.  Nausea vomiting.  Denies any right knee pain at the time prior to syncope.  He is unable to place weight on his right knee or walk due to pain now. Denies smoking, drug use, alcohol use.  No family history of premature coronary artery disease.  He runs 5 times per week for 45 minutes.  ED Course: Blood pressure 164/86, afebrile, heart rate 58, respiratory rate 16 and satting 100% room air.  Troponin negative.  Pertinent labs CO2 21, glucose 105, creatinine 1.33, T. bili 1.3.  CBC unremarkable.  COVID influenza negative  CT of the head without any acute abnormalities. X-ray of right knee no acute abnormality.  Review of Systems: All systems reviewed and otherwise negative.    Past Medical History:  Diagnosis Date   Anal fissure    Anxiety    Elevated PSA     Past Surgical History:  Procedure Laterality Date   WISDOM TOOTH EXTRACTION     age 42     reports that he has never smoked. He has never used smokeless tobacco. He reports that he does not drink alcohol and does not use drugs.  No Known Allergies  Family History  Problem Relation Age  of Onset   Heart disease Maternal Grandmother    Diabetes Maternal Grandfather    Diabetes Paternal Grandmother    Heart disease Paternal Grandfather    Breast cancer Mother    Cancer Father    Diabetes Father    Colon cancer Neg Hx    Rectal cancer Neg Hx    Stomach cancer Neg Hx    Esophageal cancer Neg Hx      Prior to Admission medications   Medication Sig Start Date End Date Taking? Authorizing Provider  citalopram (CELEXA) 20 MG tablet TAKE 1 TABLET ONCE DAILY. 06/15/21   Sheliah Hatch, MD  COVID-19 mRNA bivalent vaccine, Pfizer, (PFIZER COVID-19 VAC BIVALENT) injection Inject into the muscle. 03/19/21   Judyann Munson, MD  FEROSUL 325 (65 Fe) MG tablet TAKE 1 TABLET EVERY DAY WITH BREAKFAST. 08/18/20   Sheliah Hatch, MD  fluticasone (FLONASE) 50 MCG/ACT nasal spray Place 2 sprays into both nostrils daily. 06/14/16   Waldon Merl, PA-C    Physical Exam: Vitals:   08/25/21 1400 08/25/21 1545 08/25/21 1600 08/25/21 1630  BP: (!) 164/86 (!) 153/79 (!) 164/83 (!) 159/83  Pulse: (!) 58 62 65 66  Resp: 16 (!) 21 17 16   Temp:      TempSrc:      SpO2: 100% 100% 100% 94%  Weight:  Height:        Constitutional: NAD, calm, comfortable Vitals:   08/25/21 1400 08/25/21 1545 08/25/21 1600 08/25/21 1630  BP: (!) 164/86 (!) 153/79 (!) 164/83 (!) 159/83  Pulse: (!) 58 62 65 66  Resp: 16 (!) 21 17 16   Temp:      TempSrc:      SpO2: 100% 100% 100% 94%  Weight:      Height:       Eyes: PERRL, lids and conjunctivae normal ENMT: Mucous membranes are moist. Posterior pharynx clear of any exudate or lesions.Normal dentition.  Neck: normal, supple, no masses, no thyromegaly Respiratory: clear to auscultation bilaterally, no wheezing, no crackles. Normal respiratory effort. No accessory muscle use.  Cardiovascular: Regular rate and rhythm, no murmurs / rubs / gallops. No extremity edema. 2+ pedal pulses. No carotid bruits.  Abdomen: no tenderness, no masses palpated.  No hepatosplenomegaly. Bowel sounds positive.  Musculoskeletal: no clubbing / cyanosis. No joint deformity upper and lower extremities. Good ROM, no contractures. Normal muscle tone.  Skin: no rashes, lesions, ulcers. No induration Neurologic: CN 2-12 grossly intact. Sensation intact, Strength 5/5 in all 4.  Psychiatric: Normal judgment and insight. Alert and oriented x 3. Normal mood.    Labs on Admission: I have personally reviewed following labs and imaging studies  CBC: Recent Labs  Lab 08/25/21 1400  WBC 5.7  NEUTROABS 4.0  HGB 15.7  HCT 46.9  MCV 80.9  PLT 259   Basic Metabolic Panel: Recent Labs  Lab 08/25/21 1400  NA 136  K 4.3  CL 100  CO2 21*  GLUCOSE 105*  BUN 16  CREATININE 1.33*  CALCIUM 9.5   GFR: Estimated Creatinine Clearance: 60.5 mL/min (A) (by C-G formula based on SCr of 1.33 mg/dL (H)). Liver Function Tests: Recent Labs  Lab 08/25/21 1400  AST 25  ALT 20  ALKPHOS 102  BILITOT 1.3*  PROT 7.5  ALBUMIN 4.2   No results for input(s): LIPASE, AMYLASE in the last 168 hours. No results for input(s): AMMONIA in the last 168 hours. Coagulation Profile: No results for input(s): INR, PROTIME in the last 168 hours. Cardiac Enzymes: No results for input(s): CKTOTAL, CKMB, CKMBINDEX, TROPONINI in the last 168 hours. BNP (last 3 results) No results for input(s): PROBNP in the last 8760 hours. HbA1C: No results for input(s): HGBA1C in the last 72 hours. CBG: Recent Labs  Lab 08/25/21 1339  GLUCAP 89   Lipid Profile: No results for input(s): CHOL, HDL, LDLCALC, TRIG, CHOLHDL, LDLDIRECT in the last 72 hours. Thyroid Function Tests: No results for input(s): TSH, T4TOTAL, FREET4, T3FREE, THYROIDAB in the last 72 hours. Anemia Panel: No results for input(s): VITAMINB12, FOLATE, FERRITIN, TIBC, IRON, RETICCTPCT in the last 72 hours. Urine analysis:    Component Value Date/Time   BILIRUBINUR neg 12/16/2010 0826   PROTEINUR trace 12/16/2010 0826    UROBILINOGEN 0.2 12/16/2010 0826   NITRITE neg 12/16/2010 0826   LEUKOCYTESUR neg 12/16/2010 0826    Radiological Exams on Admission: CT HEAD WO CONTRAST  Result Date: 08/25/2021 CLINICAL DATA:  Head trauma, abnormal mental status (Age 61-64y) EXAM: CT HEAD WITHOUT CONTRAST TECHNIQUE: Contiguous axial images were obtained from the base of the skull through the vertex without intravenous contrast. RADIATION DOSE REDUCTION: This exam was performed according to the departmental dose-optimization program which includes automated exposure control, adjustment of the mA and/or kV according to patient size and/or use of iterative reconstruction technique. COMPARISON:  CT head April 28, 2019. FINDINGS:  Brain: No evidence of acute infarction, hemorrhage, hydrocephalus, extra-axial collection or mass lesion/mass effect. Vascular: No hyperdense vessel identified. Skull: No acute fracture. Sinuses/Orbits: Clear sinuses.  Unremarkable orbits. Other: No mastoid effusions. IMPRESSION: No evidence of acute intracranial abnormality. Electronically Signed   By: Feliberto Harts M.D.   On: 08/25/2021 15:27   DG Knee Complete 4 Views Right  Result Date: 08/25/2021 CLINICAL DATA:  Right knee pain post fall today EXAM: RIGHT KNEE - COMPLETE 4+ VIEW COMPARISON:  None FINDINGS: Osseous mineralization normal. Mild scattered joint space narrowing. No acute fracture, dislocation, or bone destruction. No definite knee joint effusion. IMPRESSION: No acute abnormalities. Probable mild degenerative changes. Electronically Signed   By: Ulyses Southward M.D.   On: 08/25/2021 14:55    EKG: Independently reviewed. SR, no ischemic st/t changes  Assessment/Plan Principal Problem:   Syncope    1.syncope Concerning since happened during exertion. Need to r/o cardiac event/etiology Will place on telemetry Ck echo Cardiology consulted May need stress testing if echo nml. Check orthostatics as creatinine up mildly.  2.AKI Likely  prerenal. Start IVF for hydration Ck am levels   3. Rt knee pain Heard a pop, now unable to stand on feet. RT knee xr without acute abnormality Orthopedics consulted. ?may need MRI , r/o meniscus tear or ? If unable to stand /place weight. No swelling on exam today     DVT prophylaxis: Lovenox Code Status: Full Family Communication: Family at bedside Disposition Plan: Back: Consults called: Cardiology and orthopedics Admission status: Observation as patient needs less than 2 midnight stays   Lynn Ito MD   If 7PM-7AM, please contact night-coverage [email or web address] Password [facility]  08/25/2021, 5:05 PM

## 2021-08-25 NOTE — ED Provider Notes (Signed)
?[facility] EMERGENCY DEPARTMENT ?Provider Note ? ? ?CSN: [id number] ?Arrival date & time: 08/25/21  1325 ? ?  ? ?History ?Chief Complaint  ?Patient presents with  ? Loss of Consciousness  ? Fall  ? ? ?Alexander Schroeder is a 56 y.o. male with no significant past medical history who presents to the emergency department after a syncopal episode while running.  Patient was running on his lunch break which he typically does every day and apparently lost consciousness and hit his head.  He does not recall losing consciousness nor did he have any prodromal symptoms.  His Apple watch recognized the fall and called 911.  Patient awoke in the ambulance.  Patient is complaining of head pain localized to the left side of the occiput.  He also reports associated nausea but denies any vomiting, focal weakness or numbness, chest pain, shortness of breath, fever, chills, cough, congestion. ? ?Of note, patient does have a history of iron deficiency anemia secondary to donating blood and has not donated blood in some time. ? ? ?Loss of Consciousness ?Fall ? ? ?  ? ?Home Medications ?Prior to Admission medications   ?Medication Sig Start Date End Date Taking? Authorizing Provider  ?citalopram (CELEXA) 20 MG tablet TAKE 1 TABLET ONCE DAILY. 06/15/21   Sheliah Hatch, MD  ?COVID-19 mRNA bivalent vaccine, Pfizer, (PFIZER COVID-19 VAC BIVALENT) injection Inject into the muscle. 03/19/21   Judyann Munson, MD  ?FEROSUL 325 (65 Fe) MG tablet TAKE 1 TABLET EVERY DAY WITH BREAKFAST. 08/18/20   Sheliah Hatch, MD  ?fluticasone (FLONASE) 50 MCG/ACT nasal spray Place 2 sprays into both nostrils daily. 06/14/16   Waldon Merl, PA-C  ?   ? ?Allergies    ?Patient has no known allergies.   ? ?Review of Systems   ?Review of Systems  ?Cardiovascular:  Positive for syncope.  ?All other systems reviewed and are negative. ? ?Physical Exam ?Updated Vital Signs ?BP (!) 164/86   Pulse (!) 58   Temp 98.3 ?F (36.8 ?C) (Oral)   Resp  16   Ht 5\' 9"  (1.753 m)   Wt 79.4 kg   SpO2 100%   BMI 25.84 kg/m?  ?Physical Exam ?Vitals and nursing note reviewed.  ?Constitutional:   ?   General: He is not in acute distress. ?   Appearance: Normal appearance.  ?HENT:  ?   Head: Normocephalic and atraumatic.  ?Eyes:  ?   General:     ?   Right eye: No discharge.     ?   Left eye: No discharge.  ?Neck:  ?   Comments: No midline cervical tenderness. C-spine clear. ?Cardiovascular:  ?   Comments: Regular rate and rhythm.  S1/S2 are distinct without any evidence of murmur, rubs, or gallops.  Radial pulses are 2+ bilaterally.  Dorsalis pedis pulses are 2+ bilaterally.  No evidence of pedal edema. ?Pulmonary:  ?   Comments: Clear to auscultation bilaterally.  Normal effort.  No respiratory distress.  No evidence of wheezes, rales, or rhonchi heard throughout. ?Abdominal:  ?   General: Abdomen is flat. Bowel sounds are normal. There is no distension.  ?   Tenderness: There is no abdominal tenderness. There is no guarding or rebound.  ?Musculoskeletal:     ?   General: Normal range of motion.  ?   Cervical back: Full passive range of motion without pain, normal range of motion and neck supple.  ?   Comments: Right knee is nontender  to palpation no worse swollen with any obvious deformity or ecchymosis.  ?Skin: ?   General: Skin is warm and dry.  ?   Findings: No rash.  ?Neurological:  ?   General: No focal deficit present.  ?   Mental Status: He is alert.  ?   GCS: GCS eye subscore is 4. GCS verbal subscore is 5. GCS motor subscore is 6.  ?   Comments: Patient is alert however he is slower to respond.  He does answer all questions appropriately.  ?Psychiatric:     ?   Mood and Affect: Mood normal.     ?   Behavior: Behavior normal.  ? ? ?ED Results / Procedures / Treatments   ?Labs ?(all labs ordered are listed, but only abnormal results are displayed) ?Labs Reviewed  ?RESP PANEL BY RT-PCR (FLU A&B, COVID) [id number]  ?CBC WITH DIFFERENTIAL/PLATELET  ?COMPREHENSIVE  METABOLIC PANEL  ?URINALYSIS, ROUTINE W REFLEX MICROSCOPIC  ?CBG MONITORING, ED  ?CBG MONITORING, ED  ?TROPONIN I (HIGH SENSITIVITY)  ? ? ?EKG ?EKG Interpretation ? ?Date/Time:  Wednesday August 25 2021 13:36:02 EDT ?Ventricular Rate:  57 ?PR Interval:  176 ?QRS Duration: 99 ?QT Interval:  444 ?QTC Calculation: 433 ?R Axis:   18 ?Text Interpretation: Sinus rhythm Atrial premature complex Minimal ST elevation, anterior leads Appears similar to prior EKG Confirmed by Ernie AvenaLawsing, James ([id number]) on 08/25/2021 1:44:13 PM ? ?Radiology ?No results found. ? ?Procedures ?Procedures  ? ?Medications Ordered in ED ?Medications  ?lactated ringers infusion ( Intravenous New Bag/Given 08/25/21 1349)  ? ? ?ED Course/ Medical Decision Making/ A&P ?  ?                        ?Medical Decision Making ?Amount and/or Complexity of Data Reviewed ?Labs: ordered. ?Radiology: ordered. ? ?Risk ?Prescription drug management. ? ? ?This patient presents to the ED for concern of syncope, this involves an extensive number of treatment options, and is a complaint that carries with it a high risk of complications and morbidity.  The differential diagnosis includes cardiac etiology such as ACS, dysrhythmia, dehydration. I doubt valvular disease. I doubt neurologically mediated.   ? ? ?Co morbidities that complicate the patient evaluation ? ?None ? ? ?Additional history obtained: ? ?Additional history obtained from nursing note ?External records from outside source obtained and reviewed including previous PCP note back in November. ? ? ?Lab Tests: ? ?I Ordered, and personally interpreted labs.  The pertinent results include: CBC shows no significant abnormality. CMP is normal apart from slightly elevated creatinine in comparison to previous results. Initial troponin negative. Respiratory panel was negative. Delta troponin is pending. CBG is normal. UA is pending. ? ? ?Imaging Studies ordered: ? ?I ordered imaging studies including chest xray  ?I independently  visualized and interpreted imaging which showed normal ?I agree with the radiologist interpretation ? ? ?Cardiac Monitoring: ? ?The patient was maintained on a cardiac monitor.  I personally viewed and interpreted the cardiac monitored which showed an underlying rhythm of: Normal sinus rhythm  ? ? ?Medicines ordered and prescription drug management: ? ?I ordered medication including LR bolus for possible dehydration ?Reevaluation of the patient after these medicines showed that the patient stayed the same ?I have reviewed the patients home medicines and have made adjustments as needed ? ? ?Problem List / ED Course: ? ?Syncope with no cardiac risk factors. Given that the patient has no significant risk factors still has a concerning history for  cardiac etiology. Most of his work up is still pending but I do believe he could likely be brought in for syncope observation and possible echo. The rest of his care will be transferred to [facility] for ultimate disposition.  ? ? ?Social Determinants of Health: ? ?Married ? ? ?Dispostion: ? ?The rest of his care will be transferred to [facility]  for ultimate disposition. Rest of workup still pending. ? ?Final Clinical Impression(s) / ED Diagnoses ?Final diagnoses:  ?None  ? ? ?Rx / DC Orders ?ED Discharge Orders   ? ? None  ? ?  ? ? ?  ?Teressa Lower, PA-C ?08/25/21 1530 ? ?  ?Gwyneth Sprout, MD ?08/26/21 0710 ? ?

## 2021-08-25 NOTE — ED Notes (Signed)
Wife at BS

## 2021-08-25 NOTE — ED Notes (Signed)
Patient called out from room with complaints of being nauseous and vomiting. Echo tech at bedside and had to pause exam due to patient vomiting.  ?

## 2021-08-25 NOTE — ED Triage Notes (Signed)
EDPA into room, at BS.  

## 2021-08-25 NOTE — ED Triage Notes (Signed)
BIB [facility] s/p syncope while running on lunch break at work, witnessed syncope, Apple watch called 911 for "hard fall", often runs on his lunch break, did not eat or drink anything this am, no h/o sz, was incontinent of urine and diaphoretic. Orthostatic for EMS. C/o dizziness and nausea sitting up. Hematoma to back of head. Takes iron for h/o anemia. Alert, NAD, calm, interactive, repetitive questions.  ?

## 2021-08-25 NOTE — ED Provider Notes (Signed)
Care of patient assumed from [facility] at 1500.  Agree with history, physical exam and plan.  See their note for further details. Briefly, 56 year old male with no significant past medical history presents to the emergency department with a chief complaint of syncopal episode.  Patient was running on his lunch break when he had a loss of consciousness and hit his head.  Patient does not recall syncopal episode and did not have any prodromal syndromes.  Complains of pain to the left side of his occipital region. ? ? ?Physical Exam  ?BP (!) 164/86   Pulse (!) 58   Temp 98.3 ?F (36.8 ?C) (Oral)   Resp 16   Ht 5\' 9"  (1.753 m)   Wt 79.4 kg   SpO2 100%   BMI 25.84 kg/m?  ? ?Physical Exam ?Vitals and nursing note reviewed.  ?Constitutional:   ?   General: He is not in acute distress. ?   Appearance: He is not ill-appearing, toxic-appearing or diaphoretic.  ?HENT:  ?   Head: Normocephalic.  ?Eyes:  ?   General: No scleral icterus.    ?   Right eye: No discharge.     ?   Left eye: No discharge.  ?Cardiovascular:  ?   Rate and Rhythm: Normal rate.  ?   Pulses:     ?     Radial pulses are 2+ on the right side and 2+ on the left side.  ?   Heart sounds: Normal heart sounds, S1 normal and S2 normal. Heart sounds not distant. No murmur heard. ?Pulmonary:  ?   Effort: Pulmonary effort is normal. No tachypnea, bradypnea or respiratory distress.  ?   Breath sounds: Normal breath sounds. No stridor.  ?Musculoskeletal:  ?   Right lower leg: No edema.  ?   Left lower leg: No edema.  ?Skin: ?   General: Skin is warm and dry.  ?Neurological:  ?   General: No focal deficit present.  ?   Mental Status: He is alert and oriented to person, place, and time.  ?   GCS: GCS eye subscore is 4. GCS verbal subscore is 5. GCS motor subscore is 6.  ?Psychiatric:     ?   Behavior: Behavior is cooperative.  ? ? ?Procedures  ?Procedures ? ?ED Course / MDM  ? ?Clinical Course as of 08/25/21 1623  ?Wed Aug 25, 2021  ?1622 Urinalysis, Routine w  reflex microscopic [PB]  ?  ?Clinical Course User Index ?[PB] 1623, PA-C  ? ?Medical Decision Making ?Amount and/or Complexity of Data Reviewed ?Labs: ordered. Decision-making details documented in ED Course. ?Radiology: ordered. ? ?Risk ?Prescription drug management. ?Decision regarding hospitalization. ? ? ?At time of handoff troponin and noncontrast head CT pending. ? ?On my exam patient reports that he has felt dizzy since waking in the ambulance.  Denies having any dizziness, chest pain, shortness of breath or sudden onset of headache prior to his syncopal episode. ? ?Labs were independently viewed and interpreted by myself.  Pertinent findings include: ?CMP shows creatinine elevated at 1.33 ?Troponin 4 ?CBC unremarkable ? ?Due to patient's syncopal episode with exertion we will consult hospitalist for admission for observation and possible echocardiogram. ? ?I spoke with Dr. Haskel Schroeder who will see the patient for admission. ? ?  ?Marylu Lund, PA-C ?08/25/21 1800 ? ?  ?08/27/21, MD ?08/26/21 1650 ? ?

## 2021-08-26 ENCOUNTER — Encounter: Payer: Self-pay | Admitting: Cardiology

## 2021-08-26 ENCOUNTER — Observation Stay (HOSPITAL_COMMUNITY): Payer: BC Managed Care – PPO

## 2021-08-26 DIAGNOSIS — R55 Syncope and collapse: Secondary | ICD-10-CM | POA: Diagnosis not present

## 2021-08-26 DIAGNOSIS — I951 Orthostatic hypotension: Secondary | ICD-10-CM

## 2021-08-26 DIAGNOSIS — R001 Bradycardia, unspecified: Secondary | ICD-10-CM | POA: Diagnosis not present

## 2021-08-26 LAB — GLUCOSE, CAPILLARY: Glucose-Capillary: 96 mg/dL (ref 70–99)

## 2021-08-26 LAB — CREATININE, SERUM
Creatinine, Ser: 1.27 mg/dL — ABNORMAL HIGH (ref 0.61–1.24)
GFR, Estimated: >60 mL/min (ref >60)

## 2021-08-26 IMAGING — MR MR KNEE*R* W/O CM
4 of 7 series · 11 of 40 positions shown · non-contrast
Comparison: Right knee radiograph [DATE]

CLINICAL DATA: Knee trauma, internal derangement suspected, xray
done

EXAM:
MRI OF THE RIGHT KNEE WITHOUT CONTRAST
TECHNIQUE: Multiplanar, multisequence MR imaging of the knee was performed. No
intravenous contrast was administered.

[Series 3: T2 fat-sat · axial · 4.0mm · 0.31mm/px · z∈[-68,+7]mm · 2 of 35 slices shown]
[im 5/35]
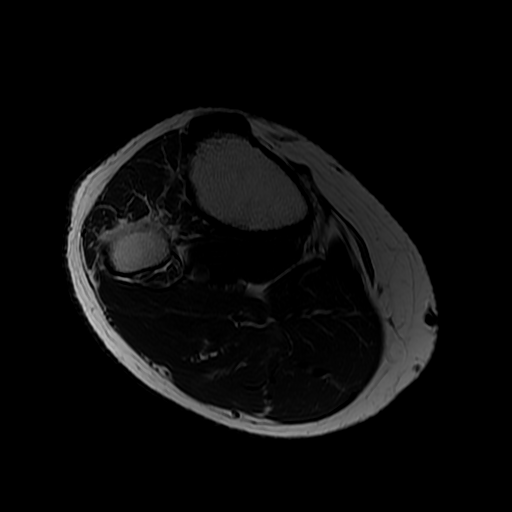
[im 20/35]
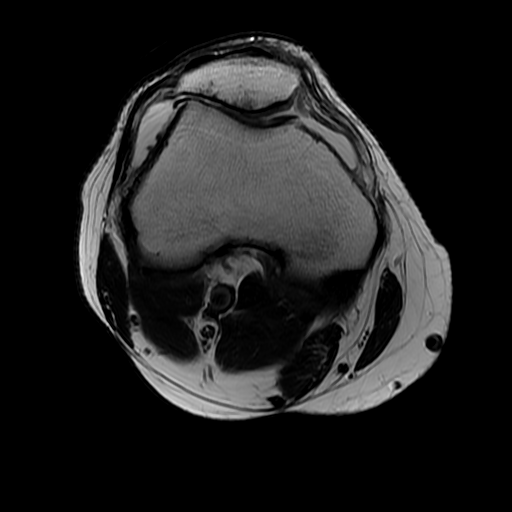

[Series 5: PD fat-sat · coronal · 4.0mm · 0.16mm/px · 3 of 22 slices shown (1 of 3)]
[im 1/22]
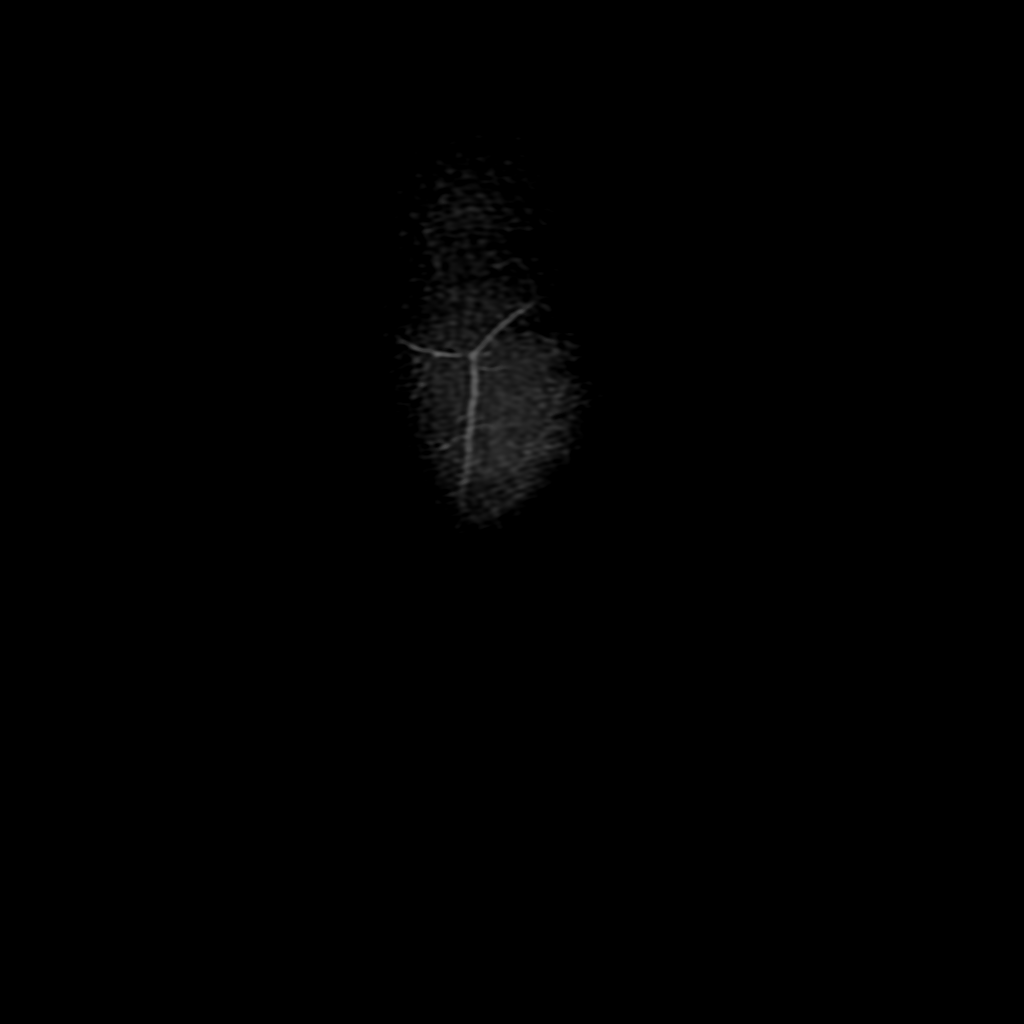
[im 11/22]
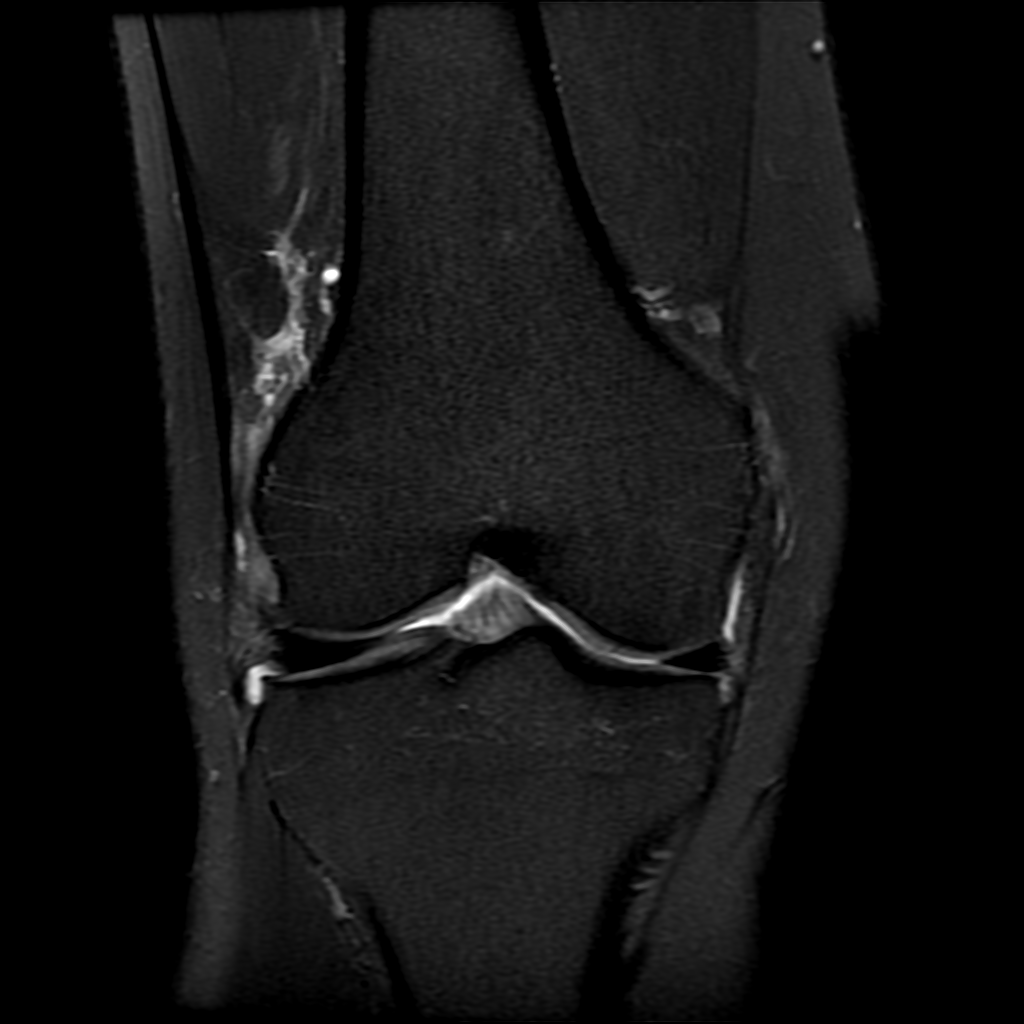
[im 22/22]
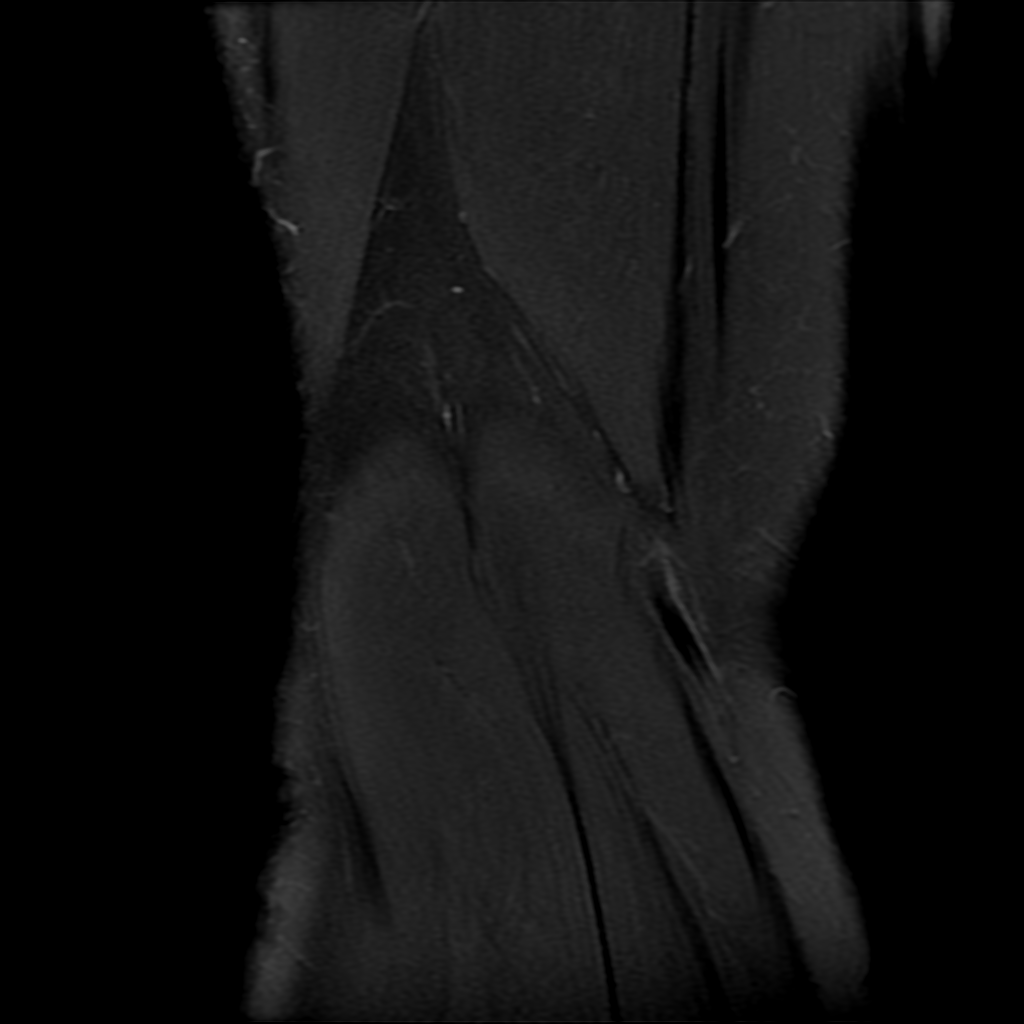

[Series 6: PD fat-sat · sagittal · 3.0mm · 0.31mm/px · 3 of 34 slices shown (2 of 3)]
[im 6/34]
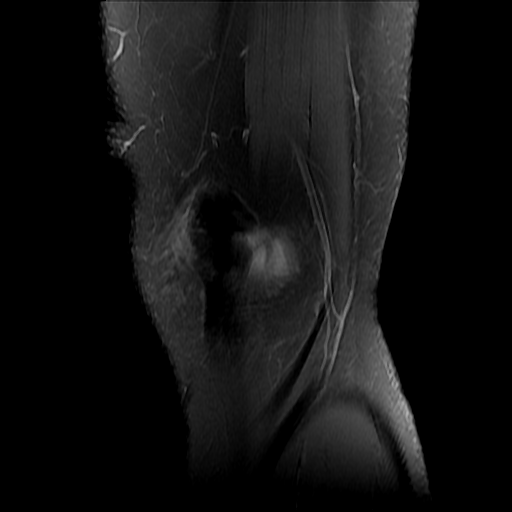
[im 17/34]
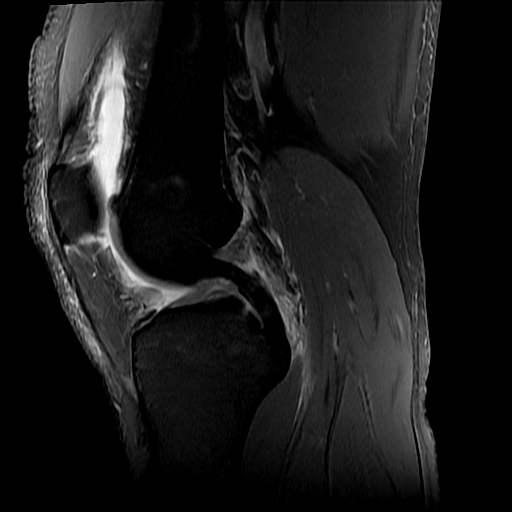
[im 28/34]
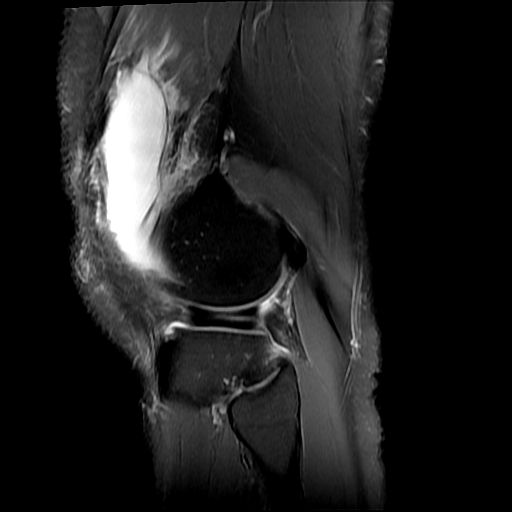

[Series 9: PD fat-sat · oblique · 2.0mm · 0.35mm/px · 3 of 15 slices shown (3 of 3)]
[im 1/15]
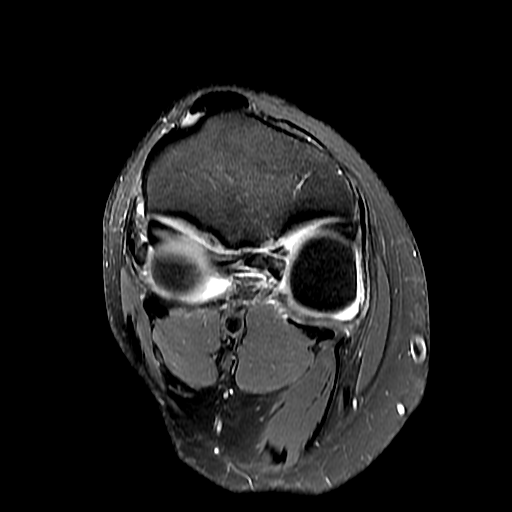
[im 8/15]
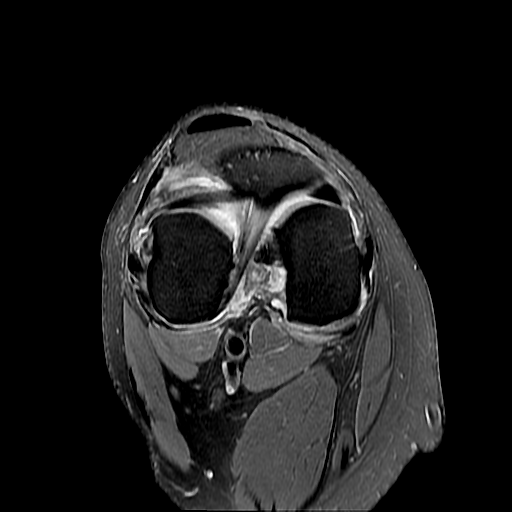
[im 15/15]
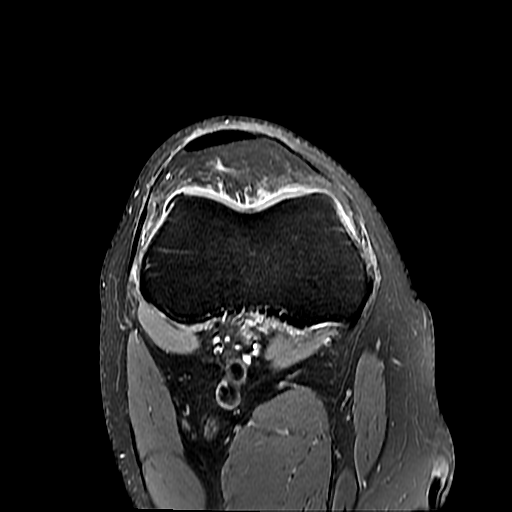

[11 of 40 positions shown; findings below may reference images not displayed]

FINDINGS: MENISCI

Medial: Intrasubstance degeneration of the posterior horn and body
of the medial meniscus. Undersurface fraying of the posterior root.
No definitive tear.

Lateral: Intrasubstance degeneration of the posterior horn and body
of the lateral meniscus. No definitive tear.

LIGAMENTS

Cruciates: ACL and PCL are intact.

Collaterals: Medial collateral ligament is intact. Lateral
collateral ligament complex is intact.

CARTILAGE

Patellofemoral: Moderate severe chondrosis with high-grade,
essentially full-thickness cartilage loss along the lateral patellar
facet and lateral trochlea. Associated marrow edema.

Medial:  No chondral defect.

Lateral:  Mild chondrosis.

JOINT: Large joint effusion.

POPLITEAL FOSSA: No Baker's cyst.

EXTENSOR MECHANISM: Intact quadriceps tendon. Intact patellar
tendon.

BONES: No aggressive osseous lesion. No fracture or dislocation.
Lateral and patellofemoral osteophyte formation.

Other: No fluid collection or hematoma. Muscles are normal.
IMPRESSION: Intrasubstance degeneration in the posterior horn and body of the
medial and lateral menisci. Undersurface fraying at the medial
posterior root. No definitive meniscus tear.

Patellofemoral predominant osteoarthritis with severe cartilage loss
along the lateral patellar facet and lateral trochlea.

Large joint effusion.

## 2021-08-26 MED ORDER — ACETAMINOPHEN 325 MG PO TABS: 650.0000 mg | ORAL_TABLET | Freq: Four times a day (QID) | ORAL | Status: DC | PRN

## 2021-08-26 MED ORDER — METOPROLOL TARTRATE 5 MG/5ML IV SOLN
2.5000 mg | Freq: Four times a day (QID) | INTRAVENOUS | Status: DC | PRN
  Administered 2021-08-26: 2.5 mg via INTRAVENOUS
  Filled 2021-08-26: qty 5

## 2021-08-26 MED ORDER — KETOROLAC TROMETHAMINE 15 MG/ML IJ SOLN
15.0000 mg | INTRAMUSCULAR | Status: AC
  Administered 2021-08-26: 15 mg via INTRAVENOUS
  Filled 2021-08-26: qty 1

## 2021-08-26 MED ORDER — CITALOPRAM HYDROBROMIDE 20 MG PO TABS
20.0000 mg | ORAL_TABLET | Freq: Every day | ORAL | Status: DC
  Administered 2021-08-26 – 2021-08-27 (×2): 20 mg via ORAL
  Filled 2021-08-26 (×2): qty 1

## 2021-08-26 MED ORDER — LORAZEPAM 2 MG/ML IJ SOLN
0.5000 mg | Freq: Once | INTRAMUSCULAR | Status: AC
  Administered 2021-08-26: 0.5 mg via INTRAVENOUS
  Filled 2021-08-26: qty 1

## 2021-08-26 MED ORDER — OXYCODONE HCL 5 MG PO TABS: 5.0000 mg | ORAL_TABLET | Freq: Four times a day (QID) | ORAL | Status: DC | PRN

## 2021-08-26 MED ORDER — FERROUS SULFATE 325 (65 FE) MG PO TABS
325.0000 mg | ORAL_TABLET | Freq: Every day | ORAL | Status: DC
  Administered 2021-08-26 – 2021-08-27 (×2): 325 mg via ORAL
  Filled 2021-08-26 (×2): qty 1

## 2021-08-26 NOTE — Progress Notes (Addendum)
?  Orthostatic vitals: ? ? 08/26/21 1705  ?Vitals  ?BP (!) 171/89  ?MAP (mmHg) 113  ?ECG Heart Rate 62  ?Orthostatic Lying   ?BP- Lying 167/86  ?Pulse- Lying 59  ?Orthostatic Sitting  ?BP- Sitting 157/88  ?Pulse- Sitting 69  ?Orthostatic Standing at 0 minutes  ?BP- Standing at 0 minutes (!) 145/100  ?Pulse- Standing at 0 minutes 77  ?Orthostatic Standing at 3 minutes  ?BP- Standing at 3 minutes (!) 161/97  ?Pulse- Standing at 3 minutes 74  ? ? pt stated feeling dizzy while standing up while doing vitals. Clammy appearance. ?

## 2021-08-26 NOTE — TOC Progression Note (Signed)
Transition of Care (TOC) - Progression Note  ? ? ?Patient Details  ?Name: Alexander Schroeder ?MRN: [id number] ?Date of Birth: Jul 18, 1965 ? ?Transition of Care (TOC) CM/SW Contact  ?Amonte Brookover Renold Don, LCSWA ?Phone Number: ?08/26/2021, 9:33 AM ? ?Clinical Narrative:    ? ?Transition of Care (TOC) Screening Note ? ? ?Patient Details  ?Name: Alexander Schroeder ?Date of Birth: 06-30-1965 ? ? ?Transition of Care (TOC) CM/SW Contact:    ?Reece Agar, LCSWA ?Phone Number: ?08/26/2021, 9:34 AM ? ? ? ?Transition of Care Department [facility]) has reviewed patient and no TOC needs have been identified at this time. We will continue to monitor patient advancement through interdisciplinary progression rounds. If new patient transition needs arise, please place a TOC consult. ? ? ? ? ?  ?  ? ?Expected Discharge Plan and Services ?  ?  ?  ?  ?  ?                ?  ?  ?  ?  ?  ?  ?  ?  ?  ?  ? ? ?Social Determinants of Health (SDOH) Interventions ?  ? ?Readmission Risk Interventions ?No flowsheet data found. ? ?

## 2021-08-26 NOTE — Progress Notes (Signed)
Nursing assistant called nurse regarding pt's bp of 177/82. Pt stated having a really bad headache of pain level 8/10.  ? ?Orders for metoprolol iv 2.5 mg and Ketorolac 15 mg in place. ? ?Will continue to monitor. ?

## 2021-08-26 NOTE — Consult Note (Signed)
Reason for Consult:Right knee pain ?Referring Physician: Dow Adolpharole Hall ?Time called: 0730 ?Time at bedside: 0919 ? ? ?Alexander Schroeder is an 56 y.o. male.  ?HPI: Dennie Bibleat was running yesterday when he felt a pop in his right knee. He had immediate pain and could not bear weight. As he was hobbling home he suffered a syncopal event and was brought to the ED and admitted. Today the pain is a little better and he was able to put a small amount of weight down. He denies any prior hx/o but has been having a small amount of knee pain in that knee lately. He works as an Art gallery managerengineer. ? ?Past Medical History:  ?Diagnosis Date  ? Anal fissure   ? Anxiety   ? Elevated PSA   ? ? ?Past Surgical History:  ?Procedure Laterality Date  ? WISDOM TOOTH EXTRACTION    ? age 56  ? ? ?Family History  ?Problem Relation Age of Onset  ? Heart disease Maternal Grandmother   ? Diabetes Maternal Grandfather   ? Diabetes Paternal Grandmother   ? Heart disease Paternal Grandfather   ? Breast cancer Mother   ? Cancer Father   ? Diabetes Father   ? Colon cancer Neg Hx   ? Rectal cancer Neg Hx   ? Stomach cancer Neg Hx   ? Esophageal cancer Neg Hx   ? ? ?Social History:  reports that he has never smoked. He has never used smokeless tobacco. He reports that he does not drink alcohol and does not use drugs. ? ?Allergies: No Known Allergies ? ?Medications: I have reviewed the patient's current medications. ? ?Results for orders placed or performed during the hospital encounter of 08/25/21 (from the past 48 hour(s))  ?CBG monitoring, ED     Status: None  ? Collection Time: 08/25/21  1:39 PM  ?Result Value Ref Range  ? Glucose-Capillary 89 70 - 99 mg/dL  ?  Comment: Glucose reference range applies only to samples taken after fasting for at least 8 hours.  ?CBC with Differential     Status: None  ? Collection Time: 08/25/21  2:00 PM  ?Result Value Ref Range  ? WBC 5.7 4.0 - 10.5 K/uL  ? RBC 5.80 4.22 - 5.81 MIL/uL  ? Hemoglobin 15.7 13.0 - 17.0 g/dL  ? HCT 46.9 39.0  - 52.0 %  ? MCV 80.9 80.0 - 100.0 fL  ? MCH 27.1 26.0 - 34.0 pg  ? MCHC 33.5 30.0 - 36.0 g/dL  ? RDW 13.7 11.5 - 15.5 %  ? Platelets 259 150 - 400 K/uL  ? nRBC 0.0 0.0 - 0.2 %  ? Neutrophils Relative % 69 %  ? Neutro Abs 4.0 1.7 - 7.7 K/uL  ? Lymphocytes Relative 22 %  ? Lymphs Abs 1.2 0.7 - 4.0 K/uL  ? Monocytes Relative 7 %  ? Monocytes Absolute 0.4 0.1 - 1.0 K/uL  ? Eosinophils Relative 1 %  ? Eosinophils Absolute 0.0 0.0 - 0.5 K/uL  ? Basophils Relative 1 %  ? Basophils Absolute 0.0 0.0 - 0.1 K/uL  ? Immature Granulocytes 0 %  ? Abs Immature Granulocytes 0.02 0.00 - 0.07 K/uL  ?  Comment: Performed at [facility], [address] [place], [place] [id number]  ?Troponin I (High Sensitivity)     Status: None  ? Collection Time: 08/25/21  2:00 PM  ?Result Value Ref Range  ? Troponin I (High Sensitivity) 4 <18 ng/L  ?  Comment: (NOTE) ?Elevated high sensitivity  troponin I (hsTnI) values and significant  ?changes across serial measurements may suggest ACS but many other  ?chronic and acute conditions are known to elevate hsTnI results.  ?Refer to the "Links" section for chest pain algorithms and additional  ?guidance. ?Performed at [facility], [address], [place], [place] ?[address] ?  ?Comprehensive metabolic panel     Status: Abnormal  ? Collection Time: 08/25/21  2:00 PM  ?Result Value Ref Range  ? Sodium 136 135 - 145 mmol/L  ? Potassium 4.3 3.5 - 5.1 mmol/L  ? Chloride 100 98 - 111 mmol/L  ? CO2 21 (L) 22 - 32 mmol/L  ? Glucose, Bld 105 (H) 70 - 99 mg/dL  ?  Comment: Glucose reference range applies only to samples taken after fasting for at least 8 hours.  ? BUN 16 6 - 20 mg/dL  ? Creatinine, Ser 1.33 (H) 0.61 - 1.24 mg/dL  ? Calcium 9.5 8.9 - 10.3 mg/dL  ? Total Protein 7.5 6.5 - 8.1 g/dL  ? Albumin 4.2 3.5 - 5.0 g/dL  ? AST 25 15 - 41 U/L  ? ALT 20 0 - 44 U/L  ? Alkaline Phosphatase 102 38 - 126 U/L  ? Total Bilirubin 1.3 (H) 0.3 - 1.2 mg/dL  ? GFR, Estimated >60 >60 mL/min  ?  Comment:  (NOTE) ?Calculated using the CKD-EPI Creatinine Equation (2021) ?  ? Anion gap 15 5 - 15  ?  Comment: Performed at [facility], [address] [place], [place] [address]  ?Resp Panel by RT-PCR (Flu A&B, Covid) Nasopharyngeal Swab     Status: None  ? Collection Time: 08/25/21  2:00 PM  ? Specimen: Nasopharyngeal Swab; Nasopharyngeal(NP) swabs in vial transport medium  ?Result Value Ref Range  ? SARS Coronavirus 2 by RT PCR NEGATIVE NEGATIVE  ?  Comment: (NOTE) ?SARS-CoV-2 target nucleic acids are NOT DETECTED. ? ?The SARS-CoV-2 RNA is generally detectable in upper respiratory ?specimens during the acute phase of infection. The lowest ?concentration of SARS-CoV-2 viral copies this assay can detect is ?138 copies/mL. A negative result does not preclude SARS-Cov-2 ?infection and should not be used as the sole basis for treatment or ?other patient management decisions. A negative result may occur with  ?improper specimen collection/handling, submission of specimen other ?than nasopharyngeal swab, presence of viral mutation(s) within the ?areas targeted by this assay, and inadequate number of viral ?copies(<138 copies/mL). A negative result must be combined with ?clinical observations, patient history, and epidemiological ?information. The expected result is Negative. ? ?Fact Sheet for Patients:  ?BloggerCourse.com ? ?Fact Sheet for Healthcare Providers:  ?SeriousBroker.it ? ?This test is no t yet approved or cleared by the [place] FDA and  ?has been authorized for detection and/or diagnosis of SARS-CoV-2 by ?FDA under an Emergency Use Authorization (EUA). This EUA will remain  ?in effect (meaning this test can be used) for the duration of the ?COVID-19 declaration under Section 564(b)(1) of the Act, 21 ?U.S.C.section 360bbb-3(b)(1), unless the authorization is terminated  ?or revoked sooner.  ? ? ?  ? Influenza A by PCR NEGATIVE NEGATIVE  ? Influenza B by PCR  NEGATIVE NEGATIVE  ?  Comment: (NOTE) ?The Xpert Xpress SARS-CoV-2/FLU/RSV plus assay is intended as an aid ?in the diagnosis of influenza from Nasopharyngeal swab specimens and ?should not be used as a sole basis for treatment. Nasal washings and ?aspirates are unacceptable for Xpert Xpress SARS-CoV-2/FLU/RSV ?testing. ? ?Fact Sheet for Patients: ?BloggerCourse.com ? ?Fact Sheet  for Healthcare Providers: ?SeriousBroker.it ? ?This test is not yet approved or cleared by the [place] FDA and ?has been authorized for detection and/or diagnosis of SARS-CoV-2 by ?FDA under an Emergency Use Authorization (EUA). This EUA will remain ?in effect (meaning this test can be used) for the duration of the ?COVID-19 declaration under Section 564(b)(1) of the Act, 21 U.S.C. ?section 360bbb-3(b)(1), unless the authorization is terminated or ?revoked. ? ?Performed at [facility], [address] [place], [place] ?[address] ?  ?Troponin I (High Sensitivity)     Status: None  ? Collection Time: 08/25/21  3:47 PM  ?Result Value Ref Range  ? Troponin I (High Sensitivity) 5 <18 ng/L  ?  Comment: (NOTE) ?Elevated high sensitivity troponin I (hsTnI) values and significant  ?changes across serial measurements may suggest ACS but many other  ?chronic and acute conditions are known to elevate hsTnI results.  ?Refer to the "Links" section for chest pain algorithms and additional  ?guidance. ?Performed at [facility], [address], [place], [place] ?[address] ?  ?Urinalysis, Routine w reflex microscopic Urine, Clean Catch     Status: Abnormal  ? Collection Time: 08/25/21  7:07 PM  ?Result Value Ref Range  ? Color, Urine YELLOW YELLOW  ? APPearance HAZY (A) CLEAR  ? Specific Gravity, Urine 1.017 1.005 - 1.030  ? pH 7.0 5.0 - 8.0  ? Glucose, UA NEGATIVE NEGATIVE mg/dL  ? Hgb urine dipstick SMALL (A) NEGATIVE  ? Bilirubin Urine NEGATIVE NEGATIVE  ? Ketones, ur 80 (A) NEGATIVE  mg/dL  ? Protein, ur NEGATIVE NEGATIVE mg/dL  ? Nitrite NEGATIVE NEGATIVE  ? Leukocytes,Ua NEGATIVE NEGATIVE  ? RBC / HPF 11-20 0 - 5 RBC/hpf  ? WBC, UA 0-5 0 - 5 WBC/hpf  ? Bacteria, UA RARE (A) NONE SEEN  ?

## 2021-08-26 NOTE — Consult Note (Addendum)
?Cardiology Consultation:  ? ?Patient ID: Alexander Schroeder ?MRN: [id number]; DOB: 1965/06/17 ? ?Admit date: 08/25/2021 ?Date of Consult: 08/26/2021 ? ?PCP:  Sheliah Hatch, MD ?  ?[facility] Providers ?Cardiologist:  None    ? ? ?Patient Profile:  ? ?Alexander Schroeder is a 56 y.o. male with a hx of anxiety, iron deficiency due to donating blood in the past who is being seen 08/26/2021 for the evaluation of syncope at the request of Dr. Margo Aye. ? ?History of Present Illness:  ? ?Alexander Schroeder is a 56 year old male with above medical history. Denies any significant cardiac history. Denies smoking, drug use, or alcohol use. Denies family history of premature CAD. Patient is very active and runs 5x per week for about 45 minutes.  ? ?Patient presented to the ED on 3/15 per EMS s/p sycope while running on his lung breath. Patient reported that he had run 4.7 miles on his lunch break when he felt a pop in his knee. He was hobbling home when he suffered a syncopal event. Remembered waking up in the ambulance. Patient denies any prodromal symptoms, denies chest pain, palpitations. Had a syncopal event in the past while running a half marathon, was determined to be dehydration at that time. Patient reports that he hadn't eating that morning, had had about 25 mL fluids to drink.  ? ?Labs in the ED showed Na 136, K 4.3, BUN 16, creatinine 1.33, hemoglobin 15.7, WBC 5.7. hsTn 4>>5.  ? ?EKG showed sinus rhythm with a PAC, minimal ST elevation in leads V3-V3 (elevation <61mm, also present on EKG from 04/2019)  ?CT head showed no evidence of acute intracranial abnormality.  ?Echocardiogram on 08/25/21 showed EF 65-70%, mild LVH, normal diastolic parameters.  ?Orthostatic vital signs showed BP 154/88 lying, 145/90 sitting, 91/69 standing.  ? ?Past Medical History:  ?Diagnosis Date  ? Anal fissure   ? Anxiety   ? Elevated PSA   ? ? ?Past Surgical History:  ?Procedure Laterality Date  ? WISDOM TOOTH EXTRACTION    ? age 32  ?  ? ?Home  Medications:  ?Prior to Admission medications   ?Medication Sig Start Date End Date Taking? Authorizing Provider  ?citalopram (CELEXA) 20 MG tablet TAKE 1 TABLET ONCE DAILY. ?Patient taking differently: Take 20 mg by mouth daily. 06/15/21  Yes Sheliah Hatch, MD  ?FEROSUL 325 (65 Fe) MG tablet TAKE 1 TABLET EVERY DAY WITH BREAKFAST. ?Patient taking differently: Take 325 mg by mouth daily. 08/18/20  Yes Sheliah Hatch, MD  ?COVID-19 mRNA bivalent vaccine, Pfizer, (PFIZER COVID-19 VAC BIVALENT) injection Inject into the muscle. 03/19/21   Judyann Munson, MD  ?fluticasone Aleda Grana) 50 MCG/ACT nasal spray Place 2 sprays into both nostrils daily. ?Patient not taking: Reported on 08/25/2021 06/14/16   Waldon Merl, PA-C  ? ? ?Inpatient Medications: ?Scheduled Meds: ? enoxaparin (LOVENOX) injection  40 mg Subcutaneous Q24H  ? sodium chloride flush  3 mL Intravenous Q12H  ? ?Continuous Infusions: ? lactated ringers 75 mL/hr at 08/25/21 1735  ? ?PRN Meds: ?ondansetron (ZOFRAN) IV, senna-docusate ? ?Allergies:   No Known Allergies ? ?Social History:   ?Social History  ? ?Socioeconomic History  ? Marital status: Married  ?  Spouse name: Not on file  ? Number of children: Not on file  ? Years of education: Not on file  ? Highest education level: Not on file  ?Occupational History  ? Not on file  ?Tobacco Use  ? Smoking status: Never  ? Smokeless  tobacco: Never  ?Vaping Use  ? Vaping Use: Never used  ?Substance and Sexual Activity  ? Alcohol use: No  ? Drug use: No  ? Sexual activity: Not on file  ?Other Topics Concern  ? Not on file  ?Social History Narrative  ? Lives with wife and 2 kids (1998, [id number]).  ?   ?   ? ?Social Determinants of Health  ? ?Financial Resource Strain: Not on file  ?Food Insecurity: Not on file  ?Transportation Needs: Not on file  ?Physical Activity: Not on file  ?Stress: Not on file  ?Social Connections: Not on file  ?Intimate Partner Violence: Not on file  ?  ?Family History:   ? ?Family History   ?Problem Relation Age of Onset  ? Heart disease Maternal Grandmother   ? Diabetes Maternal Grandfather   ? Diabetes Paternal Grandmother   ? Heart disease Paternal Grandfather   ? Breast cancer Mother   ? Cancer Father   ? Diabetes Father   ? Colon cancer Neg Hx   ? Rectal cancer Neg Hx   ? Stomach cancer Neg Hx   ? Esophageal cancer Neg Hx   ?  ? ?ROS:  ?Please see the history of present illness.  ? ?All other ROS reviewed and negative.    ? ?Physical Exam/Data:  ? ?Vitals:  ? 08/26/21 0247 08/26/21 0424 08/26/21 0721 08/26/21 0819  ?BP:  (!) 141/73 135/81   ?Pulse:  67 68 63  ?Resp:  20 17 16   ?Temp:  98.3 ?F (36.8 ?C) 98.3 ?F (36.8 ?C)   ?TempSrc:  Oral Oral   ?SpO2:  96% 96%   ?Weight: 77.9 kg     ?Height:      ? ? ?Intake/Output Summary (Last 24 hours) at 08/26/2021 1002 ?Last data filed at 08/26/2021 [id number] ?Gross per 24 hour  ?Intake 120 ml  ?Output 700 ml  ?Net -580 ml  ? ?Last 3 Weights 08/26/2021 08/25/2021 08/25/2021  ?Weight (lbs) 171 lb 11.8 oz 171 lb 11.8 oz 175 lb  ?Weight (kg) 77.9 kg 77.9 kg 79.379 kg  ?   ?Body mass index is 27.72 kg/m?.  ?General:  Well nourished, well developed, in no acute distress ?HEENT: normal ?Neck: no JVD ?Vascular: Radial pulses 2+ bilaterally ?Cardiac:  normal S1, S2; RRR; no murmur ?Lungs:  clear to auscultation bilaterally, no wheezing, rhonchi or rales  ?Abd: soft, nontender, no hepatomegaly  ?Ext: no edema ?Musculoskeletal:  No deformities, BUE and BLE strength normal and equal ?Skin: warm and dry  ?Neuro:  CNs 2-12 intact, no focal abnormalities noted ?Psych:  Normal affect  ? ?EKG:  The EKG was personally reviewed and demonstrates:  sinus rhythm with a PAC, minimal ST elevation in leads V3-V3 (elevation <611mm, also present on EKG from 04/2019)  ?Telemetry:  Telemetry was personally reviewed and demonstrates:  Sinus rhythm, HR in the upper 50s ? ?Relevant CV Studies: ? ?Echocardiogram 08/25/21  ? 1. Left ventricular ejection fraction, by estimation, is 65 to 70%. The  ?left  ventricle has normal function. The left ventricle has no regional  ?wall motion abnormalities. There is mild left ventricular hypertrophy.  ?Left ventricular diastolic parameters  ?were normal.  ? 2. Right ventricular systolic function is normal. The right ventricular  ?size is normal. Tricuspid regurgitation signal is inadequate for assessing  ?PA pressure.  ? 3. The mitral valve is normal in structure. No evidence of mitral valve  ?regurgitation. No evidence of mitral stenosis.  ? 4. The aortic  valve is tricuspid. Aortic valve regurgitation is not  ?visualized. Aortic valve sclerosis is present, with no evidence of aortic  ?valve stenosis.  ? 5. The inferior vena cava is normal in size with greater than 50%  ?respiratory variability, suggesting right atrial pressure of 3 mmHg.  ? ?Laboratory Data: ? ?High Sensitivity Troponin:   ?Recent Labs  ?Lab 08/25/21 ?1400 08/25/21 ?1547  ?TROPONINIHS 4 5  ?   ?Chemistry ?Recent Labs  ?Lab 08/25/21 ?1400 08/26/21 ?0401  ?NA 136  --   ?K 4.3  --   ?CL 100  --   ?CO2 21*  --   ?GLUCOSE 105*  --   ?BUN 16  --   ?CREATININE 1.33* 1.27*  ?CALCIUM 9.5  --   ?GFRNONAA >60 >60  ?ANIONGAP 15  --   ?  ?Recent Labs  ?Lab 08/25/21 ?1400  ?PROT 7.5  ?ALBUMIN 4.2  ?AST 25  ?ALT 20  ?ALKPHOS 102  ?BILITOT 1.3*  ? ?Lipids No results for input(s): CHOL, TRIG, HDL, LABVLDL, LDLCALC, CHOLHDL in the last 168 hours.  ?Hematology ?Recent Labs  ?Lab 08/25/21 ?1400  ?WBC 5.7  ?RBC 5.80  ?HGB 15.7  ?HCT 46.9  ?MCV 80.9  ?MCH 27.1  ?MCHC 33.5  ?RDW 13.7  ?PLT 259  ? ?Thyroid No results for input(s): TSH, FREET4 in the last 168 hours.  ?BNPNo results for input(s): BNP, PROBNP in the last 168 hours.  ?DDimer No results for input(s): DDIMER in the last 168 hours. ? ? ?Radiology/Studies:  ?CT HEAD WO CONTRAST ? ?Result Date: 08/25/2021 ?CLINICAL DATA:  Head trauma, abnormal mental status (Age 43-64y) EXAM: CT HEAD WITHOUT CONTRAST TECHNIQUE: Contiguous axial images were obtained from the base of the skull  through the vertex without intravenous contrast. RADIATION DOSE REDUCTION: This exam was performed according to the departmental dose-optimization program which includes automated exposure control, adjustme

## 2021-08-26 NOTE — Progress Notes (Signed)
? ?PROGRESS NOTE ? ?Alexander Schroeder [id number] DOB: 1965-07-15 DOA: 08/25/2021 ?PCP: Sheliah Hatchabori, Katherine E, MD ? ?HPI/Recap of past 24 hours: ?Alexander Schroeder is a 56 y.o. male with medical history significant of Anxiety , iron deficiency due to donating blood in the past presents after having syncopal episodes .  Patient went running during his lunchtime at 12, had not eaten breakfast but had 24 ounces of water prior to running and he heard a pop of his right knee and was unable to run much . Then as he started to walk back to his office, he apparently passed out, as the next event he remembers is waking up in the ambulance. Pt apple watch recognized the fall and called 911. Patient c/o HA. Denies any prodromes prior to syncope.  Denies any shortness of breath, angina or anginal-like symptoms.  Nausea vomiting.  Denies any right knee pain at the time prior to syncope.  He is unable to place weight on his right knee or walk due to pain now. ?Denies smoking, drug use, alcohol use.  No family history of premature coronary artery disease.  He runs 5 times per week for 45 minutes. ?  ?ED Course: Blood pressure 164/86, afebrile, heart rate 58, respiratory rate 16 and satting 100% room air.  Troponin negative.  Pertinent labs CO2 21, glucose 105, creatinine 1.33, T. bili 1.3.  CBC unremarkable.  COVID influenza negative ?  ?CT of the head without any acute abnormalities. ?X-ray of right knee no acute abnormality. ? ?Assessment/Plan: ?Principal Problem: ?  Syncope ? ?1.Syncope suspect secondary to orthostatic hypotension from hypovolemia ?Seen by cardiology to rule out cardiac related cause of syncope. ?High-sensitivity troponin negative x2. ?2D echo LVEF 65 to 70%, LV with no regional wall motion abnormalities. ? ?  ?2.  Nonoliguric AKI ?Likely prerenal, secondary to dehydration from poor oral intake. ?Continue IVF for hydration ?Monitor urine output ?Creatinine is downtrending. ?  ?  ?3. Rt knee pain ?Heard a pop, now  unable to stand on feet. 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  No definitive meniscus tear.  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  Large joint effusion. ?Continue pain control ?Continue PT OT with assistance and fall precautions ?Patient was seen by orthopedic surgery, will follow-up outpatient. ?Per orthopedic surgery weight bearing as tolerated. ? ?4.  Elevated BP ?Not on blood pressure medication prior to admission ?Closely monitor ?IV antihypertensives as needed with parameters ?Continue to closely monitor vital signs. ? ?Chronic anxiety/depression ?Resume home Celexa. ?  ?  ?DVT prophylaxis: Lovenox subcu daily ?Code Status: Full ?Family Communication: His wife at bedside. ?Disposition Plan: Back: ?Consults called: Cardiology and orthopedics ?Admission status: Observation as patient needs less than 2 midnight stays ?  ? ? ? ? ?Status is: Observation ? ? ? ? ?Objective: ?Vitals:  ? 08/26/21 0424 08/26/21 0721 08/26/21 0819 08/26/21 1216  ?BP: (!) 141/73 135/81  (!) 159/90  ?Pulse: 67 68 63 60  ?Resp: 20 17 16 17   ?Temp: 98.3 ?F (36.8 ?C) 98.3 ?F (36.8 ?C)  98.4 ?F (36.9 ?C)  ?TempSrc: Oral Oral    ?SpO2: 96% 96%  100%  ?Weight:      ?Height:      ? ? ?Intake/Output Summary (Last 24 hours) at 08/26/2021 1618 ?Last data filed at 08/26/2021 1601 ?Gross per 24 hour  ?Intake  120 ml  ?Output 1400 ml  ?Net -1280 ml  ? ?Filed Weights  ? 08/25/21 1357 08/25/21 1915 08/26/21 0247  ?Weight: 79.4 kg 77.9 kg 77.9 kg  ? ? ?Exam: ? ?General: 56 y.o. year-old male well developed well nourished in no acute distress.  Alert and oriented x3. ?Cardiovascular: Regular rate and rhythm with no rubs or gallops.  No thyromegaly or JVD noted.   ?Respiratory: Clear to auscultation with no wheezes or rales. Good inspiratory  effort. ?Abdomen: Soft nontender nondistended with normal bowel sounds x4 quadrants. ?Musculoskeletal: Right knee effusion noted. ?Skin: No ulcerative lesions noted or rashes, ?Psychiatry: Mood is appropriate for condition and setting ? ? ?Data Reviewed: ?CBC: ?Recent Labs  ?Lab 08/25/21 ?1400  ?WBC 5.7  ?NEUTROABS 4.0  ?HGB 15.7  ?HCT 46.9  ?MCV 80.9  ?PLT 259  ? ?Basic Metabolic Panel: ?Recent Labs  ?Lab 08/25/21 ?1400 08/26/21 ?0401  ?NA 136  --   ?K 4.3  --   ?CL 100  --   ?CO2 21*  --   ?GLUCOSE 105*  --   ?BUN 16  --   ?CREATININE 1.33* 1.27*  ?CALCIUM 9.5  --   ? ?GFR: ?Estimated Creatinine Clearance: 62.2 mL/min (A) (by C-G formula based on SCr of 1.27 mg/dL (H)). ?Liver Function Tests: ?Recent Labs  ?Lab 08/25/21 ?1400  ?AST 25  ?ALT 20  ?ALKPHOS 102  ?BILITOT 1.3*  ?PROT 7.5  ?ALBUMIN 4.2  ? ?No results for input(s): LIPASE, AMYLASE in the last 168 hours. ?No results for input(s): AMMONIA in the last 168 hours. ?Coagulation Profile: ?No results for input(s): INR, PROTIME in the last 168 hours. ?Cardiac Enzymes: ?No results for input(s): CKTOTAL, CKMB, CKMBINDEX, TROPONINI in the last 168 hours. ?BNP (last 3 results) ?No results for input(s): PROBNP in the last 8760 hours. ?HbA1C: ?No results for input(s): HGBA1C in the last 72 hours. ?CBG: ?Recent Labs  ?Lab 08/25/21 ?1339 08/26/21 ?0600  ?GLUCAP 89 96  ? ?Lipid Profile: ?No results for input(s): CHOL, HDL, LDLCALC, TRIG, CHOLHDL, LDLDIRECT in the last 72 hours. ?Thyroid Function Tests: ?No results for input(s): TSH, T4TOTAL, FREET4, T3FREE, THYROIDAB in the last 72 hours. ?Anemia Panel: ?No results for input(s): VITAMINB12, FOLATE, FERRITIN, TIBC, IRON, RETICCTPCT in the last 72 hours. ?Urine analysis: ?   ?Component Value Date/Time  ? COLORURINE YELLOW 08/25/2021 1907  ? APPEARANCEUR HAZY (A) 08/25/2021 1907  ? LABSPEC 1.017 08/25/2021 1907  ? PHURINE 7.0 08/25/2021 1907  ? GLUCOSEU NEGATIVE 08/25/2021 1907  ? HGBUR SMALL (A) 08/25/2021 1907  ?  BILIRUBINUR NEGATIVE 08/25/2021 1907  ? BILIRUBINUR neg 12/16/2010 0826  ? KETONESUR 80 (A) 08/25/2021 1907  ? PROTEINUR NEGATIVE 08/25/2021 1907  ? UROBILINOGEN 0.2 12/16/2010 0826  ? NITRITE NEGATIVE 08/25/2021 1907  ? LEUKOCYTESUR NEGATIVE 08/25/2021 1907  ? ?Sepsis Labs: ?@LABRCNTIP (procalcitonin:4,lacticidven:4) ? ?) ?Recent Results (from the past 240 hour(s))  ?Resp Panel by RT-PCR (Flu A&B, Covid) Nasopharyngeal Swab     Status: None  ? Collection Time: 08/25/21  2:00 PM  ? Specimen: Nasopharyngeal Swab; Nasopharyngeal(NP) swabs in vial transport medium  ?Result Value Ref Range Status  ? SARS Coronavirus 2 by RT PCR NEGATIVE NEGATIVE Final  ?  Comment: (NOTE) ?SARS-CoV-2 target nucleic acids are NOT DETECTED. ? ?The SARS-CoV-2 RNA is generally detectable in upper respiratory ?specimens during the acute phase of infection. The lowest ?concentration of SARS-CoV-2 viral copies this assay can detect is ?138 copies/mL. A negative result does not preclude SARS-Cov-2 ?infection and should not  be used as the sole basis for treatment or ?other patient management decisions. A negative result may occur with  ?improper specimen collection/handling, submission of specimen other ?than nasopharyngeal swab, presence of viral mutation(s) within the ?areas targeted by this assay, and inadequate number of viral ?copies(<138 copies/mL). A negative result must be combined with ?clinical observations, patient history, and epidemiological ?information. The expected result is Negative. ? ?Fact Sheet for Patients:  ?BloggerCourse.com ? ?Fact Sheet for Healthcare Providers:  ?SeriousBroker.it ? ?This test is no t yet approved or cleared by the [place] FDA and  ?has been authorized for detection and/or diagnosis of SARS-CoV-2 by ?FDA under an Emergency Use Authorization (EUA). This EUA will remain  ?in effect (meaning this test can be used) for the duration of the ?COVID-19  declaration under Section 564(b)(1) of the Act, 21 ?U.S.C.section 360bbb-3(b)(1), unless the authorization is terminated  ?or revoked sooner.  ? ? ?  ? Influenza A by PCR NEGATIVE NEGATIVE Final  ? Influenza B by PCR NEGATIVE NEG

## 2021-08-26 NOTE — Progress Notes (Signed)
Orthostatic vitals as follows: ? ? ? 08/26/21 0819  ?Vitals  ?Pulse Rate 63  ?ECG Heart Rate 62  ?Resp 16  ?MEWS COLOR  ?MEWS Score Color Green  ?Orthostatic Lying   ?BP- Lying 154/88  ?Pulse- Lying 62  ?Orthostatic Sitting  ?BP- Sitting 145/90  ?Pulse- Sitting 73  ?Orthostatic Standing at 0 minutes  ?BP- Standing at 0 minutes 91/69  ?Pulse- Standing at 0 minutes 82  ?MEWS Score  ?MEWS Temp 0  ?MEWS Systolic 0  ?MEWS Pulse 0  ?MEWS RR 0  ?MEWS LOC 0  ?MEWS Score 0  ? ?Pt is currently sitting at edge of bed, dizzy, nauseated and spitting/small amount of vomit. in trashcan. Pt appears diaphoretic.  Wife at bedside.  ?

## 2021-08-27 ENCOUNTER — Other Ambulatory Visit (HOSPITAL_COMMUNITY): Payer: Self-pay

## 2021-08-27 ENCOUNTER — Observation Stay (HOSPITAL_COMMUNITY): Payer: BC Managed Care – PPO

## 2021-08-27 DIAGNOSIS — R55 Syncope and collapse: Secondary | ICD-10-CM | POA: Diagnosis not present

## 2021-08-27 LAB — GLUCOSE, CAPILLARY
Glucose-Capillary: 98 mg/dL (ref 70–99)
Glucose-Capillary: 93 mg/dL (ref 70–99)

## 2021-08-27 IMAGING — MR MR HEAD W/O CM
12 of 14 series · 38 of 48 positions shown · non-contrast
Comparison: No prior MRI, correlation is made with [DATE] CT
head

CLINICAL DATA: Syncopal episodes, dizziness

EXAM:
MRI HEAD WITHOUT CONTRAST
TECHNIQUE: Multiplanar, multiecho pulse sequences of the brain and surrounding
structures were obtained without intravenous contrast.

[Series 5: DWI · axial · 3.0mm · 0.96mm/px · z∈[-94,+72]mm · 7 of 114 slices shown (1 of 4)]
[im 1/114]
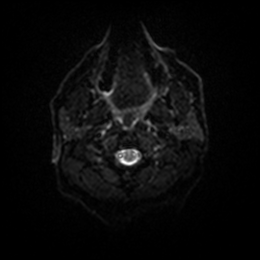
[im 19/114]
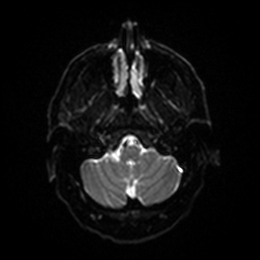
[im 38/114]
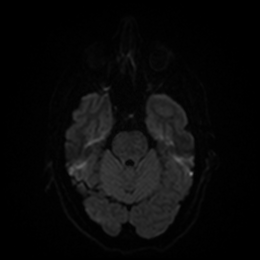
[im 57/114]
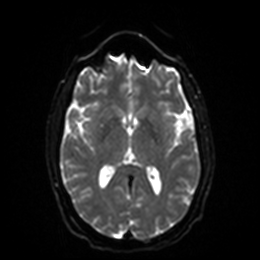
[im 76/114]
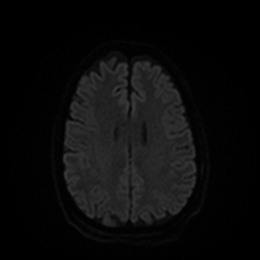
[im 95/114]
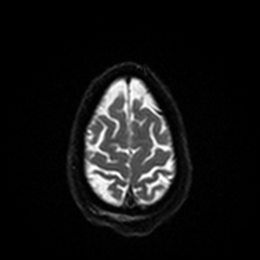
[im 114/114]
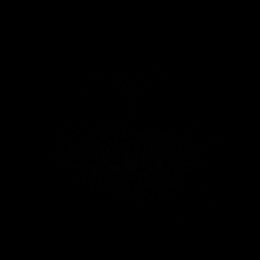

[Series 6: DWI · axial · 3.0mm · 0.96mm/px · z∈[-94,+69]mm · 3 of 55 slices shown (2 of 4)]
[im 1/55]
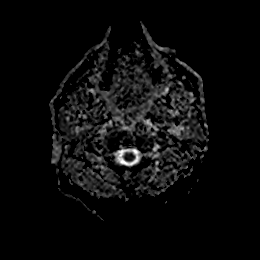
[im 28/55]
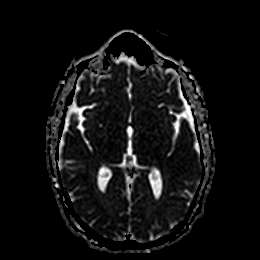
[im 55/55]
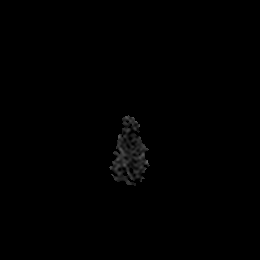

[Series 7: DWI · coronal · 4.0mm · 0.88mm/px · 5 of 88 slices shown (3 of 4)]
[im 1/88]
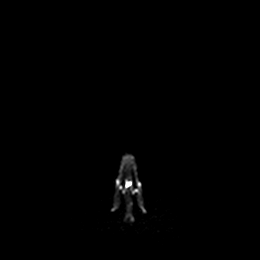
[im 22/88]
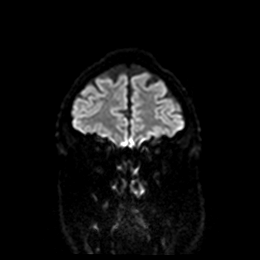
[im 44/88]
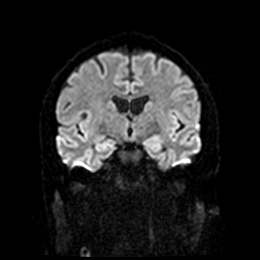
[im 66/88]
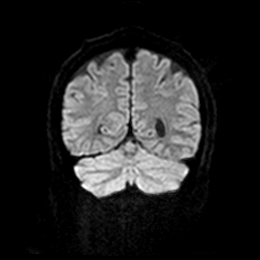
[im 88/88]
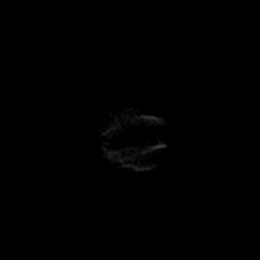

[Series 8: DWI · coronal · 4.0mm · 0.88mm/px · 3 of 44 slices shown (4 of 4)]
[im 1/44]
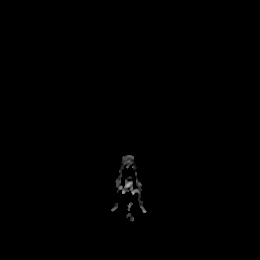
[im 22/44]
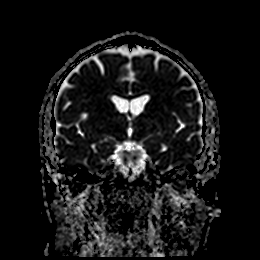
[im 44/44]
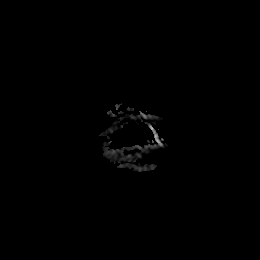

[Series 9: T1 · sagittal · 5.0mm · 0.78mm/px · 2 of 29 slices shown]
[im 1/29]
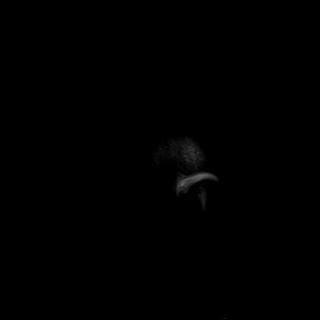
[im 29/29]
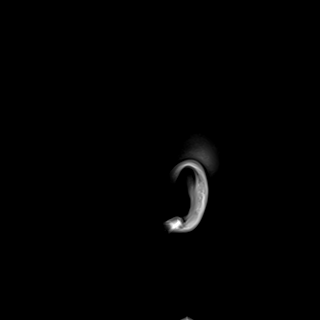

[Series 10: T2 · axial · 5.0mm · 0.78mm/px · z∈[-94,+72]mm · 2 of 29 slices shown (1 of 2)]
[im 1/29]
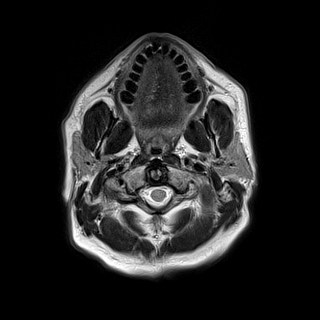
[im 29/29]
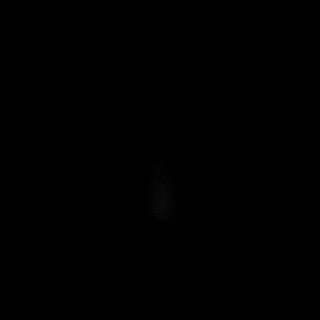

[Series 11: FLAIR · axial · 5.0mm · 0.49mm/px · z∈[-93,+74]mm · 2 of 29 slices shown]
[im 1/29]
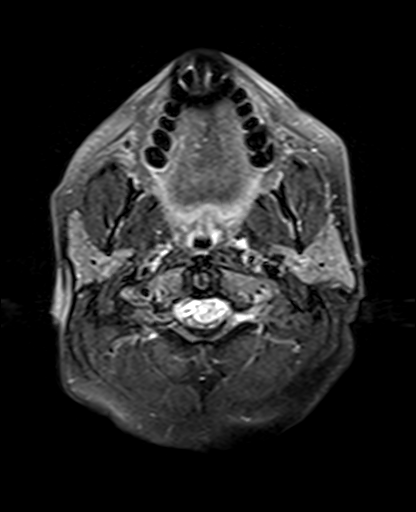
[im 29/29]
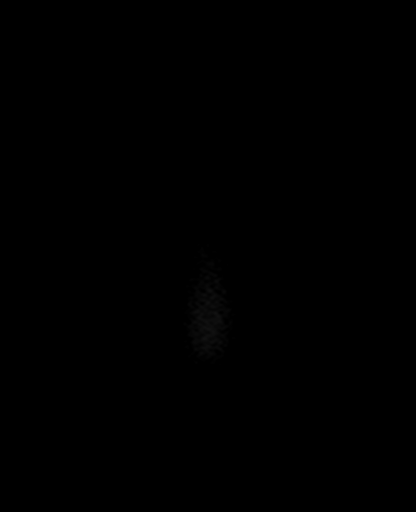

[Series 12: mag_images · axial · 3.0mm · 0.98mm/px · z∈[-91,+72]mm · 3 of 56 slices shown]
[im 1/56]
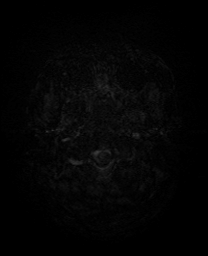
[im 28/56]
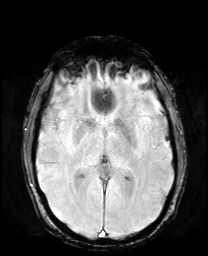
[im 56/56]
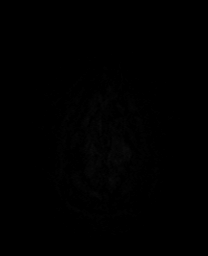

[Series 13: pha_images · axial · 3.0mm · 0.98mm/px · z∈[-91,+72]mm · 3 of 56 slices shown]
[im 1/56]
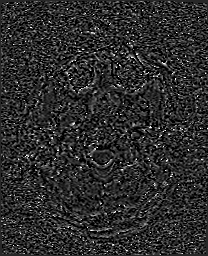
[im 28/56]
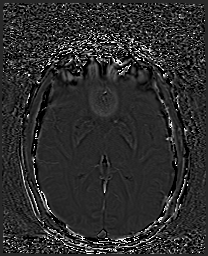
[im 56/56]
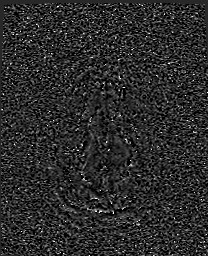

[Series 14: swi_images · axial · 3.0mm · 0.98mm/px · z∈[-91,+72]mm · 3 of 56 slices shown]
[im 1/56]
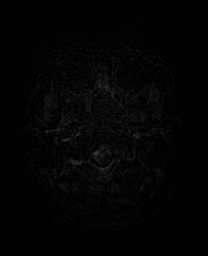
[im 28/56]
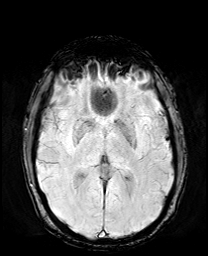
[im 56/56]
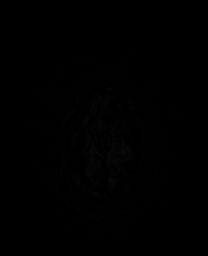

[Series 15: mip_images(sw) · axial · 24.0mm · 0.98mm/px · z∈[-81,+62]mm · 3 of 49 slices shown]
[im 1/49]
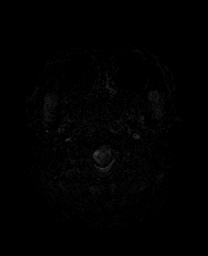
[im 25/49]
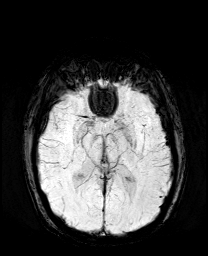
[im 49/49]
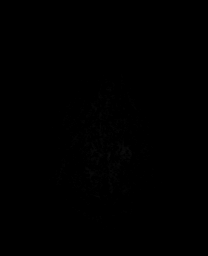

[Series 17: T2 · coronal · 5.0mm · 0.34mm/px · 2 of 36 slices shown (2 of 2)]
[im 1/36]
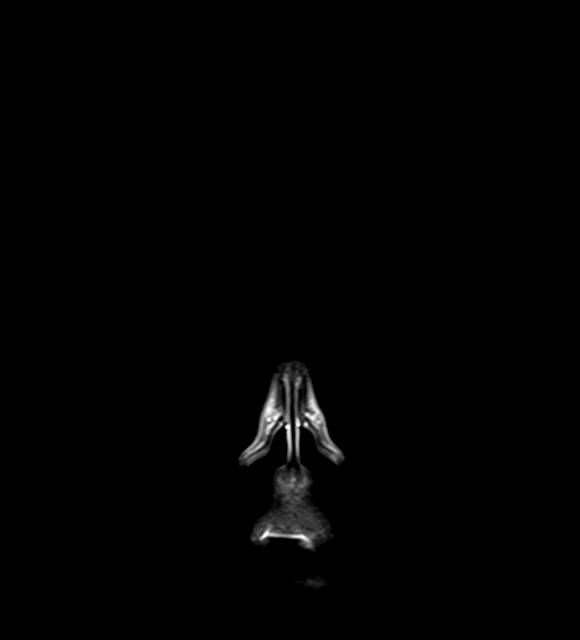
[im 36/36]
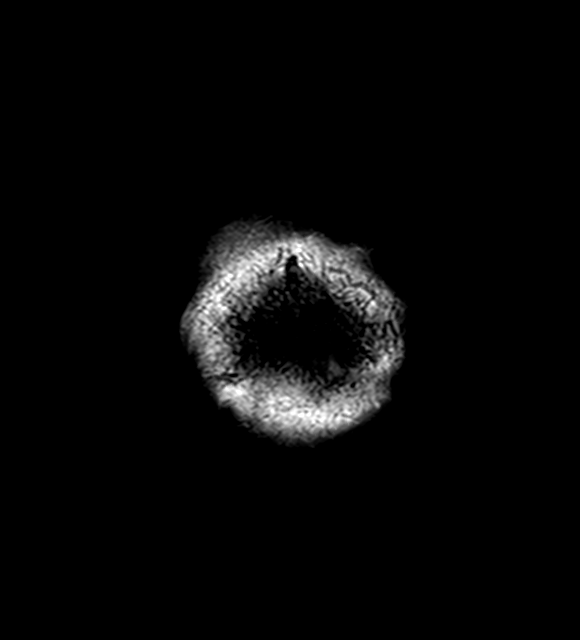

[38 of 48 positions shown; findings below may reference images not displayed]

FINDINGS: Brain: No restricted diffusion to suggest acute or subacute infarct.
No acute hemorrhage, mass, mass effect, or midline shift. No
hydrocephalus or extra-axial collection.

Vascular: Normal flow voids.

Skull and upper cervical spine: Normal marrow signal.

Sinuses/Orbits: Negative.

Other: None.
IMPRESSION: No acute intracranial process. No etiology seen for the patient's
syncope or dizziness.

## 2021-08-27 MED ORDER — AMLODIPINE BESYLATE 5 MG PO TABS
5.0000 mg | ORAL_TABLET | Freq: Every day | ORAL | 0 refills | Status: DC
  Filled 2021-08-27: qty 30, 30d supply, fill #0

## 2021-08-27 MED ORDER — LORAZEPAM 2 MG/ML IJ SOLN: 0.5000 mg | Freq: Once | INTRAMUSCULAR | Status: DC | PRN

## 2021-08-27 MED ORDER — AMLODIPINE BESYLATE 5 MG PO TABS
5.0000 mg | ORAL_TABLET | Freq: Every day | ORAL | Status: DC
  Administered 2021-08-27: 5 mg via ORAL
  Filled 2021-08-27: qty 1

## 2021-08-27 NOTE — Discharge Summary (Signed)
?Discharge Summary ? ?Dray Dente [id number] DOB: May 18, 1966 ? ?PCP: Sheliah Hatch, MD ? ?Admit date: 08/25/2021 ?Discharge date: 08/27/2021 ? ?Time spent: 35 minutes  ? ?Recommendations for Outpatient Follow-up:  ?Follow up with your primary care provider in 1 to 2 weeks. ?Follow-up with orthopedic surgery in 1 to 2 weeks. ?Follow-up with cardiology as needed. ?Take your medications as prescribed. ?Continue fall precautions. ? ?Discharge Diagnoses:  ?Active Hospital Problems  ? Diagnosis Date Noted  ? Syncope 08/25/2021  ?  ?Resolved Hospital Problems  ?No resolved problems to display.  ? ? ?Discharge Condition: Stable. ? ?Diet recommendation: Heart healthy, DASH diet. ? ?Vitals:  ? 08/27/21 0340 08/27/21 1045  ?BP: (!) 173/87 (!) 160/88  ?Pulse: (!) 54 (!) 52  ?Resp: 12 18  ?Temp: 98.4 ?F (36.9 ?C) 98.3 ?F (36.8 ?C)  ?SpO2: 99% 98%  ? ? ?History of present illness:  ?Alexander Schroeder is a 56 y.o. male with medical history significant of Anxiety, iron deficiency without anemia due to donating blood in the past presents after having a syncopal episode.  Patient went running during his lunchtime at 12, had not eaten breakfast but had 25 mL of water prior to running and he heard a pop of his right knee and was unable to run much. Then as he started to walk back to his office, he passed out.  The next event he remembers is waking up in the ambulance. Pt apple watch recognized the fall and called 911.  He denies any prodromal symptoms.  Denies chest pain or palpitations.  He had a syncopal event in the past while running a half marathon, was determined to be dehydration at that time.  Work-up revealed syncope, orthostatic plus vagal component with pain as a trigger.  Was seen by cardiology, no further work-up recommended.  Hospital course complicated by bradycardia which was thought to be physiologic, no significant arrhythmia, per cardiology. ? ?CT of the head without any acute abnormalities. ?MRI brain done on  08/27/2021 showed no acute intracranial process.  No etiology seen for the patient's syncope or dizziness. ? ?Positive orthostatic vital signs, resolved with IV fluid. ? 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  ? ?Patient was seen and followed by cardiology and orthopedic surgery. ? ?08/27/21: Patient was seen and examined at bedside.  There were no acute events overnight.  He has no new complaints. ? ?Hospital Course:  ?Principal Problem: ?  Syncope ? ?Syncope suspect secondary to orthostatic hypotension from hypovolemia ?Also suspected to have a component of vasovagal ?Seen by cardiology to rule out cardiac related cause of syncope. ?High-sensitivity troponin negative x2. ?2D echo LVEF 65 to 70%, LV with no regional wall motion abnormalities. ?Positive orthostatic vital signs, resolved after IV fluid hydration.   ? ?Nonoliguric AKI ?Likely prerenal, secondary to dehydration from poor oral intake. ?Creatinine downtrending with IV fluid hydration. ?Avoid dehydration. ? ?Elevated BP, suspect undiagnosed hypertension ?Started on Norvasc 5 mg daily ?Follow-up with your primary care provider in 1 to 2 weeks. ?  ?Rt knee pain ?Heard a pop, now unable to stand on feet. 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  No definitive meniscus tear.  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  Large joint effusion. ?Seen by orthopedic surgery, with follow-up palpation. ?Analgesics as needed, Tylenol, not to exceed more than 2 g/day. ?  ?Chronic  anxiety/depression ?Continue home Celexa. ?  ? ?Code Status: Full ? ?Consults called: Cardiology and orthopedics ? ? ?Discharge Exam: ?BP (!) 160/88 (BP Location: Right Arm)   Pulse (!) 52   Temp 98.3 ?F (36.8 ?C) (Oral)   Resp 18   Ht 5\' 6"  (1.676 m)   Wt 78.3 kg   SpO2 98%   BMI 27.86 kg/m?  ?General: 56 y.o. year-old male well developed well nourished in no acute distress.  Alert and oriented x3. ?Cardiovascular: Regular rate and rhythm with no rubs or gallops.  No thyromegaly or JVD noted.   ?Respiratory: Clear to auscultation with no wheezes or rales.  ?Abdomen: Soft nontender nondistended with normal bowel sounds x4 quadrants. ?Musculoskeletal: No lower extremity edema.  ?Psychiatry: Mood is appropriate for condition and setting ? ?Discharge Instructions ?You were cared for by a hospitalist during your hospital stay. If you have any questions about your discharge medications or the care you received while you were in the hospital after you are discharged, you can call the unit and asked to speak with the hospitalist on call if the hospitalist that took care of you is not available. Once you are discharged, your primary care physician will handle any further medical issues. Please note that NO REFILLS for any discharge medications will be authorized once you are discharged, as it is imperative that you return to your primary care physician (or establish a relationship with a primary care physician if you do not have one) for your aftercare needs so that they can reassess your need for medications and monitor your lab values. ? ?Discharge Instructions   ? ? Ambulatory referral to Physical Therapy   Complete by: As directed ?  ? ?  ? ?Allergies as of 08/27/2021   ?No Known Allergies ?  ? ?  ?Medication List  ?  ? ?STOP taking these medications   ? ?fluticasone 50 MCG/ACT nasal spray ?Commonly known as: FLONASE ?  ?Pfizer COVID-19 Vac Bivalent injection ?Generic drug: COVID-19 mRNA bivalent vaccine  08/29/2021) ?  ? ?  ? ?TAKE these medications   ? ?amLODipine 5 MG tablet ?Commonly known as: NORVASC ?Take 1 tablet (5 mg total) by mouth daily. ?Start taking on: August 28, 2021 ?  ?citalopram 20 MG tablet ?Commonly known as: CELEXA ?TAKE 1 TABLET ONCE DAILY. ?  ?FeroSul 325 (65 FE) MG tablet ?Generic drug: ferrous sulfate ?TAKE 1 TABLET EVERY DAY WITH BREAKFAST. ?What changed: See the new instructions. ?  ? ?  ? ?No Known Allergies ? Follow-up Information   ? ? August 30, 2021, MD. Go on 09/02/2021.   ?Specialty: Family Medicine ?Why: @11 :00am ?Contact information: ?[address] ?[place] ?[place] ? ? ?  ?  ? ? [id number], MD. Call today.   ?Specialty: Cardiology ?Why: Please call for a posthospital follow-up appointment. ?Contact information: ?[address] ?[address] ?[place] ?[phone] ? ? ?  ?  ? ? [facility] Follow up.   ?Why: You have been set up for outpatient therapy. The office will call you. If you dont hear from office please call the number listed above. ?Contact information: ?[address] ?[place] [address] ?[phone] ? ?  ?  ? ?  Yolonda Kidaogers, Jason Quenten, MD. Schedule an appointment as soon as possible for a visit.   ?Specialty: Orthopedic Surgery ?Why: As needed for knee pain ?Contact information: ?[address] ?[address] ?[place] [id number] ?[phone] ? ? ?  ?  ? ?  ?  ? ?  ? ? ? ?The results of significant diagnostics from this hospitalization (including imaging, microbiology, ancillary and laboratory) are listed below for reference.   ? ?Significant Diagnostic Studies: ?CT HEAD WO CONTRAST ? ?Result Date: 08/25/2021 ?CLINICAL DATA:  Head trauma, abnormal mental status (Age 58-64y) EXAM: CT HEAD WITHOUT CONTRAST TECHNIQUE: Contiguous axial images were obtained from the base of the skull through the vertex without intravenous contrast. RADIATION DOSE REDUCTION: This exam was performed according to the departmental dose-optimization  program which includes automated exposure control, adjustment of the mA and/or kV according to patient size and/or use of iterative reconstruction technique. COMPARISON:  CT head April 28, 2019. FINDINGS: Brain: No evidence of ac

## 2021-08-27 NOTE — TOC Progression Note (Signed)
Transition of Care (TOC) - Progression Note  ? ? ?Patient Details  ?Name: Alexander Schroeder ?MRN: [id number] ?Date of Birth: 03-02-1966 ? ?Transition of Care (TOC) CM/SW Contact  ?Beckie Busing, RN ?Phone [phone] ? ?08/27/2021, 9:43 AM ? ?Clinical Narrative:    ?TOC consulted for patient discharging with outpatient therapy orders. Cm spoke with patient and patient states that he does want [facility] outpatient therapy and has transportation. Outpatient therapy orders have been entered and avs updated. No other needs noted at this time. TOC will sign off.  ? ? ?  ?  ? ?Expected Discharge Plan and Services ?  ?  ?  ?  ?  ?Expected Discharge Date: 08/27/21               ?  ?  ?  ?  ?  ?  ?  ?  ?  ?  ? ? ?Social Determinants of Health (SDOH) Interventions ?  ? ?Readmission Risk Interventions ?No flowsheet data found. ? ?

## 2021-08-27 NOTE — Evaluation (Signed)
Physical Therapy Evaluation ?Patient Details ?Name: Alexander Schroeder ?MRN: [id number] ?DOB: 12-10-1965 ?Today's Date: 08/27/2021 ? ?History of Present Illness ? The pt is a 56 yo male presenting 3/15 after a syncopal event while running on his lunch break. Orthostatic and incontinent of urine upon arrival of EMS, + hematoma to the back of his head. MRI of R knee showed no significant ligamentous or tendinous injuries, only some mild degenerative changes. No significant PMH. ?  ?Clinical Impression ? Pt in bed upon arrival of PT, agreeable to evaluation at this time. Prior to admission the pt was completely independent, working outside of the home and running consistently each week for ~45 min at a time. The pt reports dizziness and room spinning only with transition from supine to sitting, sitting to supine, and when lying flat. The pt did not report any sx when in standing or with activity, does report slight limitation from R knee pain. The pt was able to complete ~150 ft ambulation in the hallway with single UE support then no UE support without LOB, even with head turns and quick changes in direction. Recommend OPPT to address R knee pain and facilitate return to activity with less pain.  ? ?VITALS:  ?- supine in bed- BP: 174/84 (109); HR: 56bpm ?- sitting EOB - BP: 149/89 (105); HR: 60bpm (pt reports room spinning for first 3-5 seconds) ?- standing - BP: 131/96 (107); HR: 71bpm ?- standing after 3 min - BP: 138/101 (111); HR: 71bpm ?- standing post ambulation - BP: 130/92 (104); HR: 67bpm ?- sitting EOB - 153/92 (110); HR: 67bpm ?  ?   ? ?Recommendations for follow up therapy are one component of a multi-disciplinary discharge planning process, led by the attending physician.  Recommendations may be updated based on patient status, additional functional criteria and insurance authorization. ? ?Follow Up Recommendations Outpatient PT (for R knee pain to allow pt to return to sport) ? ?  ?Assistance Recommended at  Discharge Intermittent Supervision/Assistance  ?Patient can return home with the following ?   ? ?  ?Equipment Recommendations None recommended by PT  ?Recommendations for Other Services ?    ?  ?Functional Status Assessment Patient has had a recent decline in their functional status and demonstrates the ability to make significant improvements in function in a reasonable and predictable amount of time.  ? ?  ?Precautions / Restrictions Precautions ?Precautions: Fall ?Precaution Comments: admitted with syncope ?Restrictions ?Weight Bearing Restrictions: Yes ?RLE Weight Bearing: Weight bearing as tolerated  ? ?  ? ?Mobility ? Bed Mobility ?Overal bed mobility: Independent ?  ?  ?  ?  ?  ?  ?General bed mobility comments: pt reports room spinning with transition from supine to sit and sit to supine but able to complete without assist. ?  ? ?Transfers ?Overall transfer level: Independent ?Equipment used: None ?  ?  ?  ?  ?  ?  ?  ?General transfer comment: no assist, limited wt shift to RLE ?  ? ?Ambulation/Gait ?Ambulation/Gait assistance: Min guard, Supervision ?Gait Distance (Feet): 150 Feet ?Assistive device: IV Pole, None ?Gait Pattern/deviations: Step-through pattern, Decreased weight shift to right, Antalgic ?Gait velocity: WFL ?Gait velocity interpretation: 1.31 - 2.62 ft/sec, indicative of limited community ambulator ?  ?General Gait Details: pt with antalgic pattern with limited wt shift onto RLE. no LOB, no reports of sx even with head turns ? ?  ? ?Balance Overall balance assessment: Independent ?  ?  ?  ?  ?  ?  ?  ?  ?  ?  ?  ?  ?  ?  ?  ?  ?  ?  ?   ? ? ? ?  Pertinent Vitals/Pain Pain Assessment ?Pain Assessment: Faces ?Pain Score: 5  ?Faces Pain Scale: Hurts little more ?Pain Location: R knee (posterior aspect of knee and posterior thigh) as well as anterior-lateral aspect of shin along TA ?Pain Descriptors / Indicators: Dull ?Pain Intervention(s): Limited activity within patient's tolerance, Monitored  during session, Repositioned, Ice applied  ? ? ?Home Living Family/patient expects to be discharged to:: Private residence ?Living Arrangements: Spouse/significant other ?Available Help at Discharge: Family;Available PRN/intermittently ?Type of Home: House ?Home Access: Stairs to enter ?Entrance Stairs-Rails: Right;Left ?Entrance Stairs-Number of Steps: 3 ?Alternate Level Stairs-Number of Steps: flight ?Home Layout: Multi-level;Able to live on main level with bedroom/bathroom ?Home Equipment: None ?   ?  ?Prior Function Prior Level of Function : Independent/Modified Independent;Working/employed;Driving ?  ?  ?  ?  ?  ?  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 fully independent ?ADLs Comments: independent ?  ? ? ?Hand Dominance  ? Dominant Hand: Right ? ?  ?Extremity/Trunk Assessment  ? Upper Extremity Assessment ?Upper Extremity Assessment: Overall WFL for tasks assessed ?  ? ?Lower Extremity Assessment ?Lower Extremity Assessment: RLE deficits/detail ?RLE Deficits / Details: limited by pain but pt able to maintain against mod MMT reports pain but otherwise no change in sensation ?RLE: Unable to fully assess due to pain ?RLE Sensation: WNL ?RLE Coordination: WNL ?  ? ?Cervical / Trunk Assessment ?Cervical / Trunk Assessment: Normal  ?Communication  ? Communication: No difficulties  ?Cognition Arousal/Alertness: Awake/alert ?Behavior During Therapy: [facility] for tasks assessed/performed ?Overall Cognitive Status: Within Functional Limits for tasks assessed ?  ?  ?  ?  ?  ?  ?  ?  ?  ?  ?  ?  ?  ?  ?  ?  ?  ?  ?  ? ?  ?General Comments General comments (skin integrity, edema, etc.): BP elevated upon my arrival, SBP in 130s with all OOB mobility and pt reports no sx. HR below 70 through session ? ?  ?   ? ?Assessment/Plan  ?  ?PT Assessment Patient needs continued PT services  ?PT Problem List Decreased strength;Decreased range of motion;Decreased activity tolerance;Decreased  balance;Decreased mobility ? ?   ?  ?PT Treatment Interventions DME instruction;Gait training;Stair training;Functional mobility training;Therapeutic activities;Therapeutic exercise;Patient/family education   ? ?PT Goals (Current goals can be found in the Care Plan section)  ?Acute Rehab PT Goals ?Patient Stated Goal: return home ?PT Goal Formulation: With patient ?Time For Goal Achievement: 09/10/21 ?Potential to Achieve Goals: Good ? ?  ?Frequency Min 2X/week ?  ? ? ?   ?AM-PAC PT "6 Clicks" Mobility  ?Outcome Measure Help needed turning from your back to your side while in a flat bed without using bedrails?: None ?Help needed moving from lying on your back to sitting on the side of a flat bed without using bedrails?: None ?Help needed moving to and from a bed to a chair (including a wheelchair)?: None ?Help needed standing up from a chair using your arms (e.g., wheelchair or bedside chair)?: None ?Help needed to walk in hospital room?: A Little ?Help needed climbing 3-5 steps with a railing? : A Little ?6 Click Score: 22 ? ?  ?End of Session Equipment Utilized During Treatment: Gait belt ?Activity Tolerance: Patient tolerated treatment well ?Patient left: in bed;with call bell/phone within reach;with family/visitor present ?Nurse Communication: Mobility status ?PT Visit Diagnosis: Other abnormalities of gait and mobility (R26.89);Pain ?Pain - Right/Left: Right ?  Pain - part of body: Knee ?  ? ?Time: 6789-3810 ?PT Time Calculation (min) (ACUTE ONLY): 35 min ? ? ?Charges:   PT Evaluation ?$PT Eval Moderate Complexity: 1 Mod ?PT Treatments ?$Therapeutic Exercise: 8-22 mins ?  ?   ? ? ?Vickki Muff, PT, DPT  ? ?Acute Rehabilitation Department ?Pager #: [phone] - [phone] ? ?Alexander Schroeder ?08/27/2021, 9:01 AM ? ?

## 2021-08-27 NOTE — Progress Notes (Signed)
Explained discharge summary to patient and wife. Reviewed follow up appointments and next medication administration times. Both verbalized having understanding. Removed IV and telemetry. CCMD was notified that the patient is discharging home. All belongings are in his  possession. ?

## 2021-08-27 NOTE — Plan of Care (Signed)

## 2021-08-27 NOTE — Progress Notes (Signed)
? ?Progress Note ? ?Patient Name: Alexander Schroeder ?Date of Encounter: 08/27/2021 ? ?Primary Cardiologist:   None ? ? ?Subjective  ? ?Knee pain.  No further light headedness or SOB.  ? ?Inpatient Medications  ?  ?Scheduled Meds: ? amLODipine  5 mg Oral Daily  ? citalopram  20 mg Oral Daily  ? enoxaparin (LOVENOX) injection  40 mg Subcutaneous Q24H  ? ferrous sulfate  325 mg Oral Daily  ? sodium chloride flush  3 mL Intravenous Q12H  ? ?Continuous Infusions: ? lactated ringers 75 mL/hr at 08/27/21 0400  ? ?PRN Meds: ?acetaminophen, LORazepam, metoprolol tartrate, ondansetron (ZOFRAN) IV, oxyCODONE, senna-docusate  ? ?Vital Signs  ?  ?Vitals:  ? 08/26/21 1705 08/26/21 2000 08/27/21 0340 08/27/21 0345  ?BP: (!) 171/89 (!) 168/83 (!) 173/87   ?Pulse:  (!) 58 (!) 54   ?Resp:  19 12   ?Temp:  98.4 ?F (36.9 ?C) 98.4 ?F (36.9 ?C)   ?TempSrc:  Oral Oral   ?SpO2:  99% 99%   ?Weight:    78.3 kg  ?Height:      ? ? ?Intake/Output Summary (Last 24 hours) at 08/27/2021 0804 ?Last data filed at 08/27/2021 0400 ?Gross per 24 hour  ?Intake 2210 ml  ?Output 975 ml  ?Net 1235 ml  ? ?Filed Weights  ? 08/25/21 1915 08/26/21 0247 08/27/21 0345  ?Weight: 77.9 kg 77.9 kg 78.3 kg  ? ? ?Telemetry  ?  ?RRR, SB - Personally Reviewed ? ?ECG  ?  ?NA - Personally Reviewed ? ?Physical Exam  ? ?GEN: No acute distress.   ?Neck: No  JVD ?Cardiac: RRR, no murmurs, rubs, or gallops.  ?Respiratory: Clear  to auscultation bilaterally. ?GI: Soft, nontender, non-distended  ?MS: No  edema; No deformity. ?Neuro:  Nonfocal  ?Psych: Normal affect  ? ?Labs  ?  ?Chemistry ?Recent Labs  ?Lab 08/25/21 ?1400 08/26/21 ?0401  ?NA 136  --   ?K 4.3  --   ?CL 100  --   ?CO2 21*  --   ?GLUCOSE 105*  --   ?BUN 16  --   ?CREATININE 1.33* 1.27*  ?CALCIUM 9.5  --   ?PROT 7.5  --   ?ALBUMIN 4.2  --   ?AST 25  --   ?ALT 20  --   ?ALKPHOS 102  --   ?BILITOT 1.3*  --   ?GFRNONAA >60 >60  ?ANIONGAP 15  --   ?  ? ?Hematology ?Recent Labs  ?Lab 08/25/21 ?1400  ?WBC 5.7  ?RBC 5.80  ?HGB  15.7  ?HCT 46.9  ?MCV 80.9  ?MCH 27.1  ?MCHC 33.5  ?RDW 13.7  ?PLT 259  ? ? ?Cardiac EnzymesNo results for input(s): TROPONINI in the last 168 hours. No results for input(s): TROPIPOC in the last 168 hours.  ? ?BNPNo results for input(s): BNP, PROBNP in the last 168 hours.  ? ?DDimer No results for input(s): DDIMER in the last 168 hours.  ? ?Radiology  ?  ?CT HEAD WO CONTRAST ? ?Result Date: 08/25/2021 ?CLINICAL DATA:  Head trauma, abnormal mental status (Age 63-64y) EXAM: CT HEAD WITHOUT CONTRAST TECHNIQUE: Contiguous axial images were obtained from the base of the skull through the vertex without intravenous contrast. RADIATION DOSE REDUCTION: This exam was performed according to the departmental dose-optimization program which includes automated exposure control, adjustment of the mA and/or kV according to patient size and/or use of iterative reconstruction technique. COMPARISON:  CT head April 28, 2019. FINDINGS: Brain: No evidence of acute infarction,  hemorrhage, hydrocephalus, extra-axial collection or mass lesion/mass effect. Vascular: No hyperdense vessel identified. Skull: No acute fracture. Sinuses/Orbits: Clear sinuses.  Unremarkable orbits. Other: No mastoid effusions. IMPRESSION: No evidence of acute intracranial abnormality. Electronically Signed   By: Feliberto Harts M.D.   On: 08/25/2021 15:27  ? ?MR BRAIN WO CONTRAST ? ?Result Date: 08/27/2021 ?CLINICAL DATA:  Syncopal episodes, dizziness EXAM: MRI HEAD WITHOUT CONTRAST TECHNIQUE: Multiplanar, multiecho pulse sequences of the brain and surrounding structures were obtained without intravenous contrast. COMPARISON:  No prior MRI, correlation is made with 08/25/2021 CT head FINDINGS: Brain: No restricted diffusion to suggest acute or subacute infarct. No acute hemorrhage, mass, mass effect, or midline shift. No hydrocephalus or extra-axial collection. Vascular: Normal flow voids. Skull and upper cervical spine: Normal marrow signal. Sinuses/Orbits:  Negative. Other: None. IMPRESSION: No acute intracranial process. No etiology seen for the patient's syncope or dizziness. Electronically Signed   By: Wiliam Ke M.D.   On: 08/27/2021 03:35  ? ?MR KNEE RIGHT WO CONTRAST ? ?Result Date: 08/26/2021 ?CLINICAL DATA:  Knee trauma, internal derangement suspected, xray done EXAM: MRI OF THE RIGHT KNEE WITHOUT CONTRAST TECHNIQUE: Multiplanar, multisequence MR imaging of the knee was performed. No intravenous contrast was administered. COMPARISON:  Right knee radiograph 08/25/2021 FINDINGS: MENISCI Medial: Intrasubstance degeneration of the posterior horn and body of the medial meniscus. Undersurface fraying of the posterior root. No definitive tear. Lateral: Intrasubstance degeneration of the posterior horn and body of the lateral meniscus. No definitive tear. LIGAMENTS Cruciates: ACL and PCL are intact. Collaterals: Medial collateral ligament is intact. Lateral collateral ligament complex is intact. CARTILAGE Patellofemoral: Moderate severe chondrosis with high-grade, essentially full-thickness cartilage loss along the lateral patellar facet and lateral trochlea. Associated marrow edema. Medial:  No chondral defect. Lateral:  Mild chondrosis. JOINT: Large joint effusion. POPLITEAL FOSSA: No Baker's cyst. EXTENSOR MECHANISM: Intact quadriceps tendon. Intact patellar tendon. BONES: No aggressive osseous lesion. No fracture or dislocation. Lateral and patellofemoral osteophyte formation. Other: No fluid collection or hematoma. Muscles are normal. IMPRESSION: Intrasubstance degeneration in the posterior horn and body of the medial and lateral menisci. Undersurface fraying at the medial posterior root. No definitive meniscus tear. 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 Large joint effusion. Electronically Signed   By: Caprice Renshaw M.D.   On: 08/26/2021 11:50  ? ?DG Knee Complete 4 Views  Right ? ?Result Date: 08/25/2021 ?CLINICAL DATA:  Right knee pain post fall today EXAM: RIGHT KNEE - COMPLETE 4+ VIEW COMPARISON:  None FINDINGS: Osseous mineralization normal. Mild scattered joint space narrowing. No acute fracture, dislocation, or bone destruction. No definite knee joint effusion. IMPRESSION: No acute abnormalities. Probable mild degenerative changes. Electronically Signed   By: Ulyses Southward M.D.   On: 08/25/2021 14:55  ? ?ECHOCARDIOGRAM COMPLETE ? ?Result Date: 08/25/2021 ?   ECHOCARDIOGRAM REPORT   Patient Name:   CAILLOU MINUS Date of Exam: 08/25/2021 Medical Rec #:  [id number]      Height:       69.0 in Accession #:    [id number]     Weight:       175.0 lb Date of Birth:  1966-04-05      BSA:          1.952 m? Patient Age:    56 years       BP:           159/83 mmHg Patient Gender: M  HR:           65 bpm. Exam Location:  Inpatient Procedure: 2D Echo Indications:    syncope  History:        Patient has no prior history of Echocardiogram examinations.  Sonographer:    Delcie RochLauren Pennington RDCS Referring Phys: [id number] [facility] IMPRESSIONS  1. Left ventricular ejection fraction, by estimation, is 65 to 70%. The left ventricle has normal function. The left ventricle has no regional wall motion abnormalities. There is mild left ventricular hypertrophy. Left ventricular diastolic parameters were normal.  2. Right ventricular systolic function is normal. The right ventricular size is normal. Tricuspid regurgitation signal is inadequate for assessing PA pressure.  3. The mitral valve is normal in structure. No evidence of mitral valve regurgitation. No evidence of mitral stenosis.  4. The aortic valve is tricuspid. Aortic valve regurgitation is not visualized. Aortic valve sclerosis is present, with no evidence of aortic valve stenosis.  5. The inferior vena cava is normal in size with greater than 50% respiratory variability, suggesting right atrial pressure of 3 mmHg. FINDINGS  Left  Ventricle: Left ventricular ejection fraction, by estimation, is 65 to 70%. The left ventricle has normal function. The left ventricle has no regional wall motion abnormalities. The left ventricular internal cavity

## 2021-08-27 NOTE — Progress Notes (Addendum)
OT Screen Note ? ?Patient Details ?Name: Alexander Schroeder ?MRN: [id number] ?DOB: [birth year] ? ? ?OT Screen:    Patient working with PT this morning and demonstrating no need for acute OT services. Lives in a one story home with his wife who can provide assist if needed, is able to complete ambulation independently, as well as bed mobility and ADLs. Please refer to PT evaluation for full home set up and current level of function. OT signing off; please re-consult if further needs arise.  ? ?Alexander Schroeder, OTR/L ?Acute Rehabilitation Services ?[phone] ?[phone]  ? ?Alexander [place] Antavia Tandy ?08/27/2021, 9:05 AM ?

## 2021-08-30 ENCOUNTER — Encounter: Payer: Self-pay | Admitting: Family Medicine

## 2021-09-02 ENCOUNTER — Encounter: Payer: Self-pay | Admitting: Family Medicine

## 2021-09-02 ENCOUNTER — Ambulatory Visit (INDEPENDENT_AMBULATORY_CARE_PROVIDER_SITE_OTHER): Payer: BC Managed Care – PPO | Admitting: Family Medicine

## 2021-09-02 VITALS — BP 100/70 | HR 65 | Temp 98.2°F | Resp 16 | Wt 169.2 lb

## 2021-09-02 DIAGNOSIS — M25561 Pain in right knee: Secondary | ICD-10-CM | POA: Diagnosis not present

## 2021-09-02 DIAGNOSIS — K5900 Constipation, unspecified: Secondary | ICD-10-CM | POA: Diagnosis not present

## 2021-09-02 DIAGNOSIS — R55 Syncope and collapse: Secondary | ICD-10-CM | POA: Diagnosis not present

## 2021-09-02 DIAGNOSIS — R42 Dizziness and giddiness: Secondary | ICD-10-CM | POA: Diagnosis not present

## 2021-09-02 LAB — BASIC METABOLIC PANEL
BUN: 18 mg/dL (ref 6–23)
CO2: 31 mEq/L (ref 19–32)
Calcium: 9.6 mg/dL (ref 8.4–10.5)
Chloride: 101 mEq/L (ref 96–112)
Creatinine, Ser: 1.16 mg/dL (ref 0.40–1.50)
GFR: 69.6 mL/min (ref >60.00)
Glucose, Bld: 89 mg/dL (ref 70–99)
Potassium: 4.9 mEq/L (ref 3.5–5.1)
Sodium: 137 mEq/L (ref 135–145)

## 2021-09-02 MED ORDER — MECLIZINE HCL 25 MG PO TABS: 25.0000 mg | ORAL_TABLET | Freq: Three times a day (TID) | ORAL | 0 refills | Status: DC | PRN

## 2021-09-02 MED ORDER — DICLOFENAC SODIUM 1 % EX GEL
4.0000 g | Freq: Four times a day (QID) | CUTANEOUS | 0 refills | Status: DC
Start: 2021-09-02 — End: 2023-01-03

## 2021-09-02 MED ORDER — POLYETHYLENE GLYCOL 3350 17 GM/SCOOP PO POWD: 17.0000 g | Freq: Every day | ORAL | 1 refills | Status: DC

## 2021-09-02 NOTE — Assessment & Plan Note (Signed)
New.  Reviewed labs, imaging, ECHO, consults, H&P, and D/C summary.  Episode sounds consistent w/ a hypotensive vagal episode.  Workup supported this dx.  No need for cards f/u- per their last note.  Encouraged increased water intake.  Given low BP will stop the recently added Amlodipine.  Recheck Cr as this was mildly elevated during hospitalization.  Reviewed supportive care and red flags that should prompt return.  Pt expressed understanding and is in agreement w/ plan.  ?

## 2021-09-02 NOTE — Patient Instructions (Addendum)
Follow up in 6-8 weeks to recheck blood pressure ?We'll notify you of your lab results and make any changes if needed ?STOP the Amlodipine ?We'll call you to schedule your Ortho appt ?Continue to ice your knee to help w/ swelling ?USE the Diclofenac gel 3-4x/day on the knee for pain and inflammation ?START once daily Miralax to help w/ the bowels.  You only need to take this as needed ?USE the Meclizine if needed for vertigo ?DRINK LOTS OF WATER!!! ?Call with any questions or concerns ?Hang in there!!! ?

## 2021-09-02 NOTE — Assessment & Plan Note (Signed)
New.  Suspect this is due to pt's recent fall.  He has dizziness when lying down or rolling over.  Will start Meclizine prn.  Handout provided on modified Eply maneuver.  Reviewed MRI and CT- nothing acute seen.  Pt not to drive until dizziness resolves.  Pt expressed understanding and is in agreement w/ plan.  ?

## 2021-09-02 NOTE — Assessment & Plan Note (Signed)
New.  Likely due to pain meds given during hospitalization, iron supplement, and new physical inactivity.  Start Miralax daily until regular BMs and then PRN.  Pt expressed understanding and is in agreement w/ plan.  ?

## 2021-09-02 NOTE — Progress Notes (Signed)
? ?  Subjective:  ? ? Patient ID: Alexander Schroeder, male    DOB: 08-20-1965, 56 y.o.   MRN: [id number] ? ?HPI ?Hospital f/u- pt was admitted 3/15-17 after a syncopal episode while running.  He had not eaten breakfast that day and had had very little water.  Had a similar syncopal episode while running a half marathon in the past.  While running, he felt a pop in his R knee.  He passed out after that while trying to get back to office.  CT of head WNL.  MRI brain WNL.  + orthostasis- resolved w/ IVF.  Pt was seen by Cards and cardiac syncope was ruled out.  ECHO WNL. ? ?R knee xray was unrevealing.  MRI showed degeneration of medial and lateral menisci, severe cartilage loss and joint effusion.  Pt reports knee is still sore- particularly at night.  Is able to bear weight.  Has some difficulty straightening leg. ? ?Pt's BP was elevated while hospitalized and he was started on Amlodipine 5mg  daily. ? ?Pt reports since falling he has had dizziness- particularly when lying flat.  States the room will start spinning. ? ?Pt reports difficulty w/ BMs since being d/c'd.  Was given Oxycodone in the hospital.  Takes iron regularly.  Is now not physically active. ? ? ?Review of Systems ?For ROS see HPI  ? ?This visit occurred during the SARS-CoV-2 public health emergency.  Safety protocols were in place, including screening questions prior to the visit, additional usage of staff PPE, and extensive cleaning of exam room while observing appropriate contact time as indicated for disinfecting solutions.   ?   ?Objective:  ? Physical Exam ?Vitals reviewed.  ?Constitutional:   ?   General: He is not in acute distress. ?   Appearance: Normal appearance. He is well-developed. He is not ill-appearing.  ?HENT:  ?   Head: Normocephalic and atraumatic.  ?Eyes:  ?   Extraocular Movements: Extraocular movements intact.  ?   Conjunctiva/sclera: Conjunctivae normal.  ?   Pupils: Pupils are equal, round, and reactive to light.  ?Neck:  ?   Thyroid:  No thyromegaly.  ?Cardiovascular:  ?   Rate and Rhythm: Normal rate and regular rhythm.  ?   Pulses: Normal pulses.  ?   Heart sounds: Normal heart sounds. No murmur heard. ?Pulmonary:  ?   Effort: Pulmonary effort is normal. No respiratory distress.  ?   Breath sounds: Normal breath sounds.  ?Abdominal:  ?   General: Bowel sounds are normal. There is no distension.  ?   Palpations: Abdomen is soft.  ?Musculoskeletal:     ?   General: Swelling (R knee) present.  ?   Cervical back: Normal range of motion and neck supple.  ?   Right lower leg: No edema.  ?   Left lower leg: No edema.  ?Lymphadenopathy:  ?   Cervical: No cervical adenopathy.  ?Skin: ?   General: Skin is warm and dry.  ?Neurological:  ?   General: No focal deficit present.  ?   Mental Status: He is alert and oriented to person, place, and time.  ?   Cranial Nerves: No cranial nerve deficit.  ?Psychiatric:     ?   Mood and Affect: Mood normal.     ?   Behavior: Behavior normal.  ? ? ? ? ? ?   ?Assessment & Plan:  ? ? ?

## 2021-09-02 NOTE — Assessment & Plan Note (Signed)
New.  Pt was running prior to his syncopal even on 3/15 and felt a pop in his R knee.  His R knee is still swollen and painful.  Encouraged him to ice, elevate, and will start Voltaren gel.  Referral to Ortho placed. ?

## 2021-09-03 NOTE — Telephone Encounter (Signed)
Dr. Beverely Low I have printed this out and put it in your folder to be signed. ?

## 2021-09-03 NOTE — Progress Notes (Signed)
Communicated via mychart

## 2021-09-06 ENCOUNTER — Encounter: Payer: Self-pay | Admitting: Family Medicine

## 2021-09-07 DIAGNOSIS — Z0279 Encounter for issue of other medical certificate: Secondary | ICD-10-CM

## 2021-09-22 ENCOUNTER — Ambulatory Visit: Payer: BC Managed Care – PPO | Attending: Internal Medicine

## 2021-09-22 DIAGNOSIS — M25561 Pain in right knee: Secondary | ICD-10-CM | POA: Insufficient documentation

## 2021-09-22 DIAGNOSIS — M94261 Chondromalacia, right knee: Secondary | ICD-10-CM | POA: Diagnosis present

## 2021-09-22 DIAGNOSIS — R2681 Unsteadiness on feet: Secondary | ICD-10-CM | POA: Insufficient documentation

## 2021-09-22 DIAGNOSIS — R55 Syncope and collapse: Secondary | ICD-10-CM | POA: Insufficient documentation

## 2021-09-22 NOTE — Therapy (Signed)
?OUTPATIENT PHYSICAL THERAPY LOWER EXTREMITY EVALUATION ? ? ?Patient Name: Alexander Schroeder ?MRN: [id number] ?DOB:1966-01-06, 56 y.o., male ?Today's Date: 09/22/2021 ? ? PT End of Session - 09/22/21 1359   ? ? Visit Number 1   ? Number of Visits 4   ? Date for PT Re-Evaluation 10/20/21   ? Authorization Type BCBS   ? PT Start Time 1315   ? PT Stop Time 1400   ? PT Time Calculation (min) 45 min   ? Activity Tolerance Patient tolerated treatment well   ? [facility] assessed/performed   ? ?  ?  ? ?  ? ? ?Past Medical History:  ?Diagnosis Date  ? Anal fissure   ? Anxiety   ? Elevated PSA   ? ?Past Surgical History:  ?Procedure Laterality Date  ? WISDOM TOOTH EXTRACTION    ? age 56  ? ?Patient Active Problem List  ? Diagnosis Date Noted  ? Vertigo 09/02/2021  ? Constipation 09/02/2021  ? Syncope 08/25/2021  ? Overweight (BMI 25.0-29.9) 10/07/2020  ? Hyperlipidemia 10/07/2020  ? Elevated BP without diagnosis of hypertension 10/07/2020  ? Anxiety 05/25/2012  ? Insomnia 05/25/2012  ? Physical exam 12/16/2010  ? Acute pain of right knee 11/02/2009  ? ? ?PCP: Sheliah Hatchabori, Katherine E, MD ? ?REFERRING PROVIDER: Darlin DropHall, Carole N, DO ? ?REFERRING DIAG: R55 (ICD-10-CM) - Syncope, unspecified syncope type  ? ?THERAPY DIAG: R knee pain ? ? ?ONSET DATE: 08/27/2021  ? ?SUBJECTIVE: Patient describes a 4 week history of R knee pain following a "pop" causing him to lose his balance and fall.  Hospitalized for several days following a fall for other reasons.  Has been issued anti-inflammatories which have helped with swelling. ? ? ?PERTINENT HISTORY: ?Hospital f/u- pt was admitted 3/15-17 after a syncopal episode while running.  He had not eaten breakfast that day and had had very little water.  Had a similar syncopal episode while running a half marathon in the past.  While running, he felt a pop in his R knee.  He passed out after that while trying to get back to office.  CT of head WNL.  MRI brain WNL.  + orthostasis-  resolved w/ IVF.  Pt was seen by Cards and cardiac syncope was ruled out.  ECHO WNL. ?  ?R knee xray was unrevealing.  MRI showed degeneration of medial and lateral menisci, severe cartilage loss and joint effusion.  Pt reports knee is still sore- particularly at night.  Is able to bear weight.  Has some difficulty straightening leg. ? ?PAIN:  ?Are you having pain? Yes: NPRS scale: 4/10 ?Pain location: R knee ?Pain description: ache ?Aggravating factors: stairs ?Relieving factors: rest ? ?PRECAUTIONS: Fall ? ?WEIGHT BEARING RESTRICTIONS No ? ?FALLS:  ?Has patient fallen in last 6 months? Yes. Number of falls 1 ? ?LIVING ENVIRONMENT: ?Lives with: lives with their family ?Lives in: House/apartment ?Stairs: Yes: Internal: 16 steps; on right going up ?Has following equipment at home: None ? ?OCCUPATION: Art gallery managerengineer ? ?PLOF: Independent ? ?PATIENT GOALS return to running ? ? ?OBJECTIVE:  ? ?DIAGNOSTIC FINDINGS: EXAM: ?MRI OF THE RIGHT KNEE WITHOUT CONTRAST ?  ?TECHNIQUE: ?Multiplanar, multisequence MR imaging of the knee was performed. No ?intravenous contrast was administered. ?  ?COMPARISON:  Right knee radiograph 08/25/2021 ?  ?FINDINGS: ?MENISCI ?  ?Medial: Intrasubstance degeneration of the posterior horn and body ?of the medial meniscus. Undersurface fraying of the posterior root. ?No definitive tear. ?  ?Lateral: Intrasubstance degeneration of  the posterior horn and body ?of the lateral meniscus. No definitive tear. ?  ?LIGAMENTS ?  ?Cruciates: ACL and PCL are intact. ?  ?Collaterals: Medial collateral ligament is intact. Lateral ?collateral ligament complex is intact. ?  ?CARTILAGE ?  ?Patellofemoral: Moderate severe chondrosis with high-grade, ?essentially full-thickness cartilage loss along the lateral patellar ?facet and lateral trochlea. Associated marrow edema. ?  ?Medial:  No chondral defect. ?  ?Lateral:  Mild chondrosis. ?  ?JOINT: Large joint effusion. ?  ?POPLITEAL FOSSA: No Baker's cyst. ?  ?EXTENSOR  MECHANISM: Intact quadriceps tendon. Intact patellar ?tendon. ?  ?BONES: No aggressive osseous lesion. No fracture or dislocation. ?Lateral and patellofemoral osteophyte formation. ?  ?Other: No fluid collection or hematoma. Muscles are normal. ?  ?IMPRESSION: ?Intrasubstance degeneration in the posterior horn and body of the ?medial and lateral menisci. Undersurface fraying at the medial ?posterior root. No definitive meniscus tear. ?  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 ?  ?Large joint effusion. ? ?PATIENT SURVEYS:  ?FOTO 50 out of 77 ? ?COGNITION: ? Overall cognitive status: Within functional limits for tasks assessed   ?  ?SENSATION: ?WFL ? ?MUSCLE LENGTH: ?Hamstrings: Right 45 deg; Left 60 deg ? ?POSTURE:  ?WNL ? ?PALPATION: ?Retropatellar crepitus ? ?LE ROM: ? ?Active ROM Right ?09/22/2021 Left ?09/22/2021  ?Hip flexion [facility] WFL  ?Hip extension [facility] WFL  ?Hip abduction    ?Hip adduction    ?Hip internal rotation    ?Hip external rotation    ?Knee flexion [facility] WFL  ?Knee extension [facility] WFL  ?Ankle dorsiflexion    ?Ankle plantarflexion    ?Ankle inversion    ?Ankle eversion    ? (Blank rows = not tested) ? ?LE MMT: ? ?MMT Right ?09/22/2021 Left ?09/22/2021  ?Hip flexion 5 5  ?Hip extension    ?Hip abduction 5 5  ?Hip adduction    ?Hip internal rotation    ?Hip external rotation    ?Knee flexion 4+ 5  ?Knee extension 4+ 5  ?Ankle dorsiflexion    ?Ankle plantarflexion    ?Ankle inversion    ?Ankle eversion    ? (Blank rows = not tested) ? ?LOWER EXTREMITY SPECIAL TESTS:  ?Knee special tests: McMurray's test: negative, Patellafemoral apprehension test: positive , Patellafemoral grind test: positive , and Patella tap test (ballotable patella): negative ? ?FUNCTIONAL TESTS:  ?5 times sit to stand: 10s ? ?GAIT: ?Distance walked: 8ft x2 ?Assistive device utilized: None ?Level of assistance: Complete Independence ?Comments: normal  cadence ? ? ? ?TODAY'S TREATMENT: ?Eval and HEP ? ? ?PATIENT EDUCATION:  ?Education details: Discussed eval findings, rehab rationale and POC and patient is in agreement  ?Person educated: Patient ?Education method: Explanation and Handouts ?Education comprehension: verbalized understanding, returned demonstration, and needs further education ? ? ?HOME EXERCISE PROGRAM: ?Access Code: [email or web address] ?URL: [email or web address] ?Date: 09/22/2021 ?Prepared by: Gustavus Bryant ? ?Exercises ?- Sidelying Hip Abduction  - 2 x daily - 7 x weekly - 2 sets - 15 reps ?- Supine Active Straight Leg Raise  - 2 x daily - 7 x weekly - 2 sets - 15 reps ?- Supine ITB Stretch  - 2 x daily - 7 x weekly - 1 sets - 3 reps - 30s hold ? ?ASSESSMENT: ? ?CLINICAL IMPRESSION: ?Patient is a 56 y.o. 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  He presents with good  strength and ROM, flexibility restrictions in R ITB and hamstrings, positive retropatellar crepitus  ? ? ?OBJECTIVE IMPAIRMENTS decreased activity tolerance, decreased strength, increased fascial restrictions, impaired flexibility, and pain.  ? ?ACTIVITY LIMITATIONS community activity and running .  ? ?PERSONAL FACTORS Fitness are also affecting patient's functional outcome.  ? ? ?REHAB POTENTIAL: Good ? ?CLINICAL DECISION MAKING: Stable/uncomplicated ? ?EVALUATION COMPLEXITY: Low ? ? ?GOALS: ?Goals reviewed with patient? Yes ? ?SHORT TERM GOALS: STGs=LTGs ? ? ?LONG TERM GOALS: Target date: 10/20/2021 ? ?Patient to demonstrate independence in HEP  ?Baseline:  [id number] ?Goal status: INITIAL ? ?2.  5/5 R knee strength F/E ?Baseline: 4+/5 R knee strength in F/E ?Goal status: INITIAL ? ?3.  Descend stairs w/o apprehension ?Baseline: Apprehensive ?Goal status: INITIAL ? ?4.  70d R hamstring flexibility ?Baseline: 45d hamstring flexibility R ?Goal status: INITIAL ? ?5. Increase FOTO  score to 78 ? Baseline: 65 ? Goal status: INITIAL ? ? ?PLAN: ?PT FREQUENCY: 1x/week ? ?PT DURATION: 4 ? ?PLANNED INTERVENTIONS: Therapeutic exercises, Therapeutic activity, Neuromuscular re-education, Balance tr

## 2021-09-29 ENCOUNTER — Ambulatory Visit: Payer: BC Managed Care – PPO

## 2021-09-29 DIAGNOSIS — M25561 Pain in right knee: Secondary | ICD-10-CM

## 2021-09-29 DIAGNOSIS — R2681 Unsteadiness on feet: Secondary | ICD-10-CM

## 2021-09-29 DIAGNOSIS — M94261 Chondromalacia, right knee: Secondary | ICD-10-CM

## 2021-09-29 NOTE — Therapy (Signed)
?OUTPATIENT PHYSICAL THERAPY TREATMENT NOTE ? ? ?Patient Name: Alexander Schroeder ?MRN: [id number] ?DOB:[birth year], 56 y.o., male ?Today's Date: 09/29/2021 ? ?PCP: Sheliah Hatch, MD ?REFERRING PROVIDER: Darlin Drop, DO ? ?END OF SESSION:  ? PT End of Session - 09/29/21 1657   ? ? Visit Number 2   ? Number of Visits 4   ? Date for PT Re-Evaluation 10/20/21   ? Authorization Type BCBS   ? PT Start Time 1700   ? PT Stop Time 1740   ? PT Time Calculation (min) 40 min   ? Activity Tolerance Patient tolerated treatment well   ? Behavior During Therapy [facility] for tasks assessed/performed   ? ?  ?  ? ?  ? ? ?Past Medical History:  ?Diagnosis Date  ? Anal fissure   ? Anxiety   ? Elevated PSA   ? ?Past Surgical History:  ?Procedure Laterality Date  ? WISDOM TOOTH EXTRACTION    ? age 13  ? ?Patient Active Problem List  ? Diagnosis Date Noted  ? Vertigo 09/02/2021  ? Constipation 09/02/2021  ? Syncope 08/25/2021  ? Overweight (BMI 25.0-29.9) 10/07/2020  ? Hyperlipidemia 10/07/2020  ? Elevated BP without diagnosis of hypertension 10/07/2020  ? Anxiety 05/25/2012  ? Insomnia 05/25/2012  ? Physical exam 12/16/2010  ? Acute pain of right knee 11/02/2009  ? ? ?REFERRING DIAG: R55 (ICD-10-CM) - Syncope, unspecified syncope type  ? ?THERAPY DIAG: R knee pain ? ? ?PERTINENT HISTORY: Hospital f/u- pt was admitted 3/15-17 after a syncopal episode while running.  He had not eaten breakfast that day and had had very little water.  Had a similar syncopal episode while running a half marathon in the past.  While running, he felt a pop in his R knee.  He passed out after that while trying to get back to office.  CT of head WNL.  MRI brain WNL.  + orthostasis- resolved w/ IVF.  Pt was seen by Cards and cardiac syncope was ruled out.  ECHO WNL. ?  ?R knee xray was unrevealing.  MRI showed degeneration of medial and lateral menisci, severe cartilage loss and joint effusion.  Pt reports knee is still sore- particularly at night.  Is able to bear  weight.  Has some difficulty straightening leg. ? ?PRECAUTIONS: Fall ? ?SUBJECTIVE: No true pain to report, mild discomfort cited to anterior tib and hamstring tendons distally, has returned to walking w/o issue up to 1 hour at a time ? ?PAIN:  ?Are you having pain? No ? ? ? ? ? ?OBJECTIVE:  ?  ?DIAGNOSTIC FINDINGS: EXAM: ?MRI OF THE RIGHT KNEE WITHOUT CONTRAST ?  ?TECHNIQUE: ?Multiplanar, multisequence MR imaging of the knee was performed. No ?intravenous contrast was administered. ?  ?COMPARISON:  Right knee radiograph 08/25/2021 ?  ?FINDINGS: ?MENISCI ?  ?Medial: Intrasubstance degeneration of the posterior horn and body ?of the medial meniscus. Undersurface fraying of the posterior root. ?No definitive tear. ?  ?Lateral: Intrasubstance degeneration of the posterior horn and body ?of the lateral meniscus. No definitive tear. ?  ?LIGAMENTS ?  ?Cruciates: ACL and PCL are intact. ?  ?Collaterals: Medial collateral ligament is intact. Lateral ?collateral ligament complex is intact. ?  ?CARTILAGE ?  ?Patellofemoral: Moderate severe chondrosis with high-grade, ?essentially full-thickness cartilage loss along the lateral patellar ?facet and lateral trochlea. Associated marrow edema. ?  ?Medial:  No chondral defect. ?  ?Lateral:  Mild chondrosis. ?  ?JOINT: Large joint effusion. ?  ?POPLITEAL FOSSA: No Baker's cyst. ?  ?  EXTENSOR MECHANISM: Intact quadriceps tendon. Intact patellar ?tendon. ?  ?BONES: No aggressive osseous lesion. No fracture or dislocation. ?Lateral and patellofemoral osteophyte formation. ?  ?Other: No fluid collection or hematoma. Muscles are normal. ?  ?IMPRESSION: ?Intrasubstance degeneration in the posterior horn and body of the ?medial and lateral menisci. Undersurface fraying at the medial ?posterior root. No definitive meniscus tear. ?  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 ?  ?Large joint effusion. ?   ?PATIENT SURVEYS:  ?FOTO 6065 out of 4678 ?  ?COGNITION: ?          Overall cognitive status: Within functional limits for tasks assessed               ?           ?SENSATION: ?WFL ?  ?MUSCLE LENGTH: ?Hamstrings: Right 45 deg; Left 60 deg ?  ?POSTURE:  ?WNL ?  ?PALPATION: ?Retropatellar crepitus ?  ?LE ROM: ?  ?Active ROM Right ?09/22/2021 Left ?09/22/2021  ?Hip flexion [facility]  ?Hip extension [facility]  ?Hip abduction      ?Hip adduction      ?Hip internal rotation      ?Hip external rotation      ?Knee flexion [facility] WFL  ?Knee extension [facility] WFL  ?Ankle dorsiflexion      ?Ankle plantarflexion      ?Ankle inversion      ?Ankle eversion      ? (Blank rows = not tested) ?  ?LE MMT: ?  ?MMT Right ?09/22/2021 Left ?09/22/2021  ?Hip flexion 5 5  ?Hip extension      ?Hip abduction 5 5  ?Hip adduction      ?Hip internal rotation      ?Hip external rotation      ?Knee flexion 4+ 5  ?Knee extension 4+ 5  ?Ankle dorsiflexion      ?Ankle plantarflexion      ?Ankle inversion      ?Ankle eversion      ? (Blank rows = not tested) ?  ?LOWER EXTREMITY SPECIAL TESTS:  ?Knee special tests: McMurray's test: negative, Patellafemoral apprehension test: positive , Patellafemoral grind test: positive , and Patella tap test (ballotable patella): negative ?  ?FUNCTIONAL TESTS:  ?5 times sit to stand: 10s ?  ?GAIT: ?Distance walked: 3675ft x2 ?Assistive device utilized: None ?Level of assistance: Complete Independence ?Comments: normal cadence ?  ?  ?  ?[facility]'S TREATMENT: ?[facility]                                                DATE: 09/29/21 ?Therapeutic Exercise: ?Nustep 8 min L4 ?R ITB stretch 30s x3 ?SAQs R 30x ?R clams 30x ?R abduction 30x ?SLR 30x ?QSs R 30x ?R hamstring stretch 30s x3 ?Single leg bridge B 15/15 ?LAQs with ball squeeze 30x ?Reverse step downs R 15x ? ?  ?  ?PATIENT EDUCATION:  ?Education details: Discussed eval findings, rehab rationale and POC and patient is in agreement  ?Person educated: Patient ?Education method:  Explanation and Handouts ?Education comprehension: verbalized understanding, returned demonstration, and needs further education ?  ?  ?HOME EXERCISE PROGRAM: ?Access Code: [email or web address] ?URL: [email or web address] ?Date: 09/29/2021 ?Prepared by: Gustavus BryantJeffrey Jeremiah Curci ? ?Exercises ?- Sidelying Hip Abduction  - 2 x daily - 7 x weekly -  2 sets - 15 reps ?- Supine Active Straight Leg Raise  - 2 x daily - 7 x weekly - 2 sets - 15 reps ?- Supine ITB Stretch  - 2 x daily - 7 x weekly - 1 sets - 3 reps - 30s hold ?- Figure 4 Bridge  - 2 x daily - 7 x weekly - 1 sets - 15 reps ?- Supine Quad Set  - 2 x daily - 7 x weekly - 2 sets - 15 reps ?  ?ASSESSMENT: ?  ?CLINICAL IMPRESSION: ?Continues with no pain to R knee, feels it may be stronger.  Today's session focused on RLE strengthening with focus on VMO as well as abduction, added eccentric work as well as R ankle strengthening.  Mild extensor lag with SLR noted ?  ?  ?OBJECTIVE IMPAIRMENTS decreased activity tolerance, decreased strength, increased fascial restrictions, impaired flexibility, and pain.  ?  ?ACTIVITY LIMITATIONS community activity and running .  ?  ?PERSONAL FACTORS Fitness are also affecting patient's functional outcome.  ?  ?  ?REHAB POTENTIAL: Good ?  ?CLINICAL DECISION MAKING: Stable/uncomplicated ?  ?EVALUATION COMPLEXITY: Low ?  ?  ?GOALS: ?Goals reviewed with patient? Yes ?  ?SHORT TERM GOALS: STGs=LTGs ?  ?  ?LONG TERM GOALS: Target date: 10/20/2021 ?  ?Patient to demonstrate independence in HEP  ?Baseline:  [id number] ?Goal status: INITIAL ?  ?2.  5/5 R knee strength F/E ?Baseline: 4+/5 R knee strength in F/E ?Goal status: INITIAL ?  ?3.  Descend stairs w/o apprehension ?Baseline: Apprehensive ?Goal status: INITIAL ?  ?4.  70d R hamstring flexibility ?Baseline: 45d hamstring flexibility R ?Goal status: INITIAL ?  ?5. Increase FOTO score to 78 ?           Baseline: 65 ?           Goal status: INITIAL ?  ?  ?PLAN: ?PT FREQUENCY: 1x/week ?  ?PT DURATION:  4 ?  ?PLANNED INTERVENTIONS: Therapeutic exercises, Therapeutic activity, Neuromuscular re-education, Balance training, Gait training, Patient/Family education, Joint mobilization, Stair training, and [employer]

## 2021-10-05 ENCOUNTER — Ambulatory Visit: Payer: BC Managed Care – PPO

## 2021-10-05 DIAGNOSIS — M94261 Chondromalacia, right knee: Secondary | ICD-10-CM

## 2021-10-05 DIAGNOSIS — R2681 Unsteadiness on feet: Secondary | ICD-10-CM | POA: Diagnosis not present

## 2021-10-05 DIAGNOSIS — M25561 Pain in right knee: Secondary | ICD-10-CM

## 2021-10-05 NOTE — Therapy (Signed)
?OUTPATIENT PHYSICAL THERAPY TREATMENT NOTE ? ? ?Patient Name: Alexander Schroeder ?MRN: [id number] ?DOB:09/21/1965, 56 y.o., male ?Today's Date: 10/05/2021 ? ?PCP: Sheliah Hatch, MD ?REFERRING PROVIDER: Sheliah Hatch, MD ? ?END OF SESSION:  ? PT End of Session - 10/05/21 1608   ? ? Visit Number 3   ? Number of Visits 4   ? Date for PT Re-Evaluation 10/20/21   ? Authorization Type BCBS   ? PT Start Time 1615   ? PT Stop Time 1655   ? PT Time Calculation (min) 40 min   ? Activity Tolerance Patient tolerated treatment well   ? Behavior During Therapy [facility] for tasks assessed/performed   ? ?  ?  ? ?  ? ? ?Past Medical History:  ?Diagnosis Date  ? Anal fissure   ? Anxiety   ? Elevated PSA   ? ?Past Surgical History:  ?Procedure Laterality Date  ? WISDOM TOOTH EXTRACTION    ? age 41  ? ?Patient Active Problem List  ? Diagnosis Date Noted  ? Vertigo 09/02/2021  ? Constipation 09/02/2021  ? Syncope 08/25/2021  ? Overweight (BMI 25.0-29.9) 10/07/2020  ? Hyperlipidemia 10/07/2020  ? Elevated BP without diagnosis of hypertension 10/07/2020  ? Anxiety 05/25/2012  ? Insomnia 05/25/2012  ? Physical exam 12/16/2010  ? Acute pain of right knee 11/02/2009  ? ? ?REFERRING DIAG: R55 (ICD-10-CM) - Syncope, unspecified syncope type  ? ?THERAPY DIAG: R knee pain ? ? ?PERTINENT HISTORY: Hospital f/u- pt was admitted 3/15-17 after a syncopal episode while running.  He had not eaten breakfast that day and had had very little water.  Had a similar syncopal episode while running a half marathon in the past.  While running, he felt a pop in his R knee.  He passed out after that while trying to get back to office.  CT of head WNL.  MRI brain WNL.  + orthostasis- resolved w/ IVF.  Pt was seen by Cards and cardiac syncope was ruled out.  ECHO WNL. ?  ?R knee xray was unrevealing.  MRI showed degeneration of medial and lateral menisci, severe cartilage loss and joint effusion.  Pt reports knee is still sore- particularly at night.  Is able to  bear weight.  Has some difficulty straightening leg. ? ?PRECAUTIONS: Fall ? ?SUBJECTIVE: R knee doing well, has returned to more activities including lawn care w/o setback or pain. ? ?PAIN:  ?Are you having pain? No ? ? ? ? ? ?OBJECTIVE:  ?  ?DIAGNOSTIC FINDINGS: EXAM: ?MRI OF THE RIGHT KNEE WITHOUT CONTRAST ?  ?TECHNIQUE: ?Multiplanar, multisequence MR imaging of the knee was performed. No ?intravenous contrast was administered. ?  ?COMPARISON:  Right knee radiograph 08/25/2021 ?  ?FINDINGS: ?MENISCI ?  ?Medial: Intrasubstance degeneration of the posterior horn and body ?of the medial meniscus. Undersurface fraying of the posterior root. ?No definitive tear. ?  ?Lateral: Intrasubstance degeneration of the posterior horn and body ?of the lateral meniscus. No definitive tear. ?  ?LIGAMENTS ?  ?Cruciates: ACL and PCL are intact. ?  ?Collaterals: Medial collateral ligament is intact. Lateral ?collateral ligament complex is intact. ?  ?CARTILAGE ?  ?Patellofemoral: Moderate severe chondrosis with high-grade, ?essentially full-thickness cartilage loss along the lateral patellar ?facet and lateral trochlea. Associated marrow edema. ?  ?Medial:  No chondral defect. ?  ?Lateral:  Mild chondrosis. ?  ?JOINT: Large joint effusion. ?  ?POPLITEAL FOSSA: No Baker's cyst. ?  ?EXTENSOR MECHANISM: Intact quadriceps tendon. Intact patellar ?tendon. ?  ?  BONES: No aggressive osseous lesion. No fracture or dislocation. ?Lateral and patellofemoral osteophyte formation. ?  ?Other: No fluid collection or hematoma. Muscles are normal. ?  ?IMPRESSION: ?Intrasubstance degeneration in the posterior horn and body of the ?medial and lateral menisci. Undersurface fraying at the medial ?posterior root. No definitive meniscus tear. ?  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 ?  ?Large joint effusion. ?  ?PATIENT SURVEYS:  ?FOTO 7 out of 57 ?  ?COGNITION: ?           Overall cognitive status: Within functional limits for tasks assessed               ?           ?SENSATION: ?WFL ?  ?MUSCLE LENGTH: ?Hamstrings: Right 45 deg; Left 60 deg ?  ?POSTURE:  ?WNL ?  ?PALPATION: ?Retropatellar crepitus ?  ?LE ROM: ?  ?Active ROM Right ?09/22/2021 Left ?09/22/2021  ?Hip flexion [facility] WFL  ?Hip extension [facility] WFL  ?Hip abduction      ?Hip adduction      ?Hip internal rotation      ?Hip external rotation      ?Knee flexion [facility] WFL  ?Knee extension [facility] WFL  ?Ankle dorsiflexion      ?Ankle plantarflexion      ?Ankle inversion      ?Ankle eversion      ? (Blank rows = not tested) ?  ?LE MMT: ?  ?MMT Right ?09/22/2021 Left ?09/22/2021  ?Hip flexion 5 5  ?Hip extension      ?Hip abduction 5 5  ?Hip adduction      ?Hip internal rotation      ?Hip external rotation      ?Knee flexion 4+ 5  ?Knee extension 4+ 5  ?Ankle dorsiflexion      ?Ankle plantarflexion      ?Ankle inversion      ?Ankle eversion      ? (Blank rows = not tested) ?  ?LOWER EXTREMITY SPECIAL TESTS:  ?Knee special tests: McMurray's test: negative, Patellafemoral apprehension test: positive , Patellafemoral grind test: positive , and Patella tap test (ballotable patella): negative ?  ?FUNCTIONAL TESTS:  ?5 times sit to stand: 10s ?  ?GAIT: ?Distance walked: 53ft x2 ?Assistive device utilized: None ?Level of assistance: Complete Independence ?Comments: normal cadence ?  ?  ?  ?TODAY'S TREATMENT: ?[facility] Adult PT Treatment:                                                DATE: 10/05/21 ?Therapeutic Exercise: ?Nustep 8 min L5 ?R ITB stretch 30s x3 (cross leg) ?SAQs R 30x 5# ?SLR R 5# 2x15 ?R hamstring stretch 30s x3 ?Single leg bridge R 15x2 ?LAQs with ball squeeze 30x 5# ?Reverse step downs R 15x 6" ?Step downs 2" 15x2 ?SL heel raise against wall 15x2 ?B toe raise against wall 30x ?TKEs RTB 2x15 ? ?[facility] Adult PT Treatment:                                                DATE: 09/29/21 ?Therapeutic Exercise: ?Nustep 8 min L4 ?R ITB stretch 30s  x3 ?SAQs R  30x ?R clams 30x ?R abduction 30x ?SLR 30x ?QSs R 30x ?R hamstring stretch 30s x3 ?Single leg bridge B 15/15 ?LAQs with ball squeeze 30x ?Reverse step downs R 15x 6" ? ?  ?  ?PATIENT EDUCATION:  ?Education details: Discussed eval findings, rehab rationale and POC and patient is in agreement  ?Person educated: Patient ?Education method: Explanation and Handouts ?Education comprehension: verbalized understanding, returned demonstration, and needs further education ?  ?  ?HOME EXERCISE PROGRAM: ?Access Code: [email or web address] ?URL: [email or web address] ?Date: 09/29/2021 ?Prepared by: Gustavus BryantJeffrey Lathon Adan ? ?Exercises ?- Sidelying Hip Abduction  - 2 x daily - 7 x weekly - 2 sets - 15 reps ?- Supine Active Straight Leg Raise  - 2 x daily - 7 x weekly - 2 sets - 15 reps ?- Supine ITB Stretch  - 2 x daily - 7 x weekly - 1 sets - 3 reps - 30s hold ?- Figure 4 Bridge  - 2 x daily - 7 x weekly - 1 sets - 15 reps ?- Supine Quad Set  - 2 x daily - 7 x weekly - 2 sets - 15 reps ?  ?ASSESSMENT: ?  ?CLINICAL IMPRESSION: ?No pain to account for at start of session.  Has returned to ADLs w/o setback.  Added resistance as noted, advanced to eccentric strengthening, introduced CKC tasks with apprehension, not pain note with eccentric step downs ?  ?  ?OBJECTIVE IMPAIRMENTS decreased activity tolerance, decreased strength, increased fascial restrictions, impaired flexibility, and pain.  ?  ?ACTIVITY LIMITATIONS community activity and running .  ?  ?PERSONAL FACTORS Fitness are also affecting patient's functional outcome.  ?  ?  ?REHAB POTENTIAL: Good ?  ?CLINICAL DECISION MAKING: Stable/uncomplicated ?  ?EVALUATION COMPLEXITY: Low ?  ?  ?GOALS: ?Goals reviewed with patient? Yes ?  ?SHORT TERM GOALS: STGs=LTGs ?  ?  ?LONG TERM GOALS: Target date: 10/20/2021 ?  ?Patient to demonstrate independence in HEP  ?Baseline:  [id number] ?Goal status: INITIAL ?  ?2.  5/5 R knee strength F/E ?Baseline: 4+/5 R knee strength in F/E ?Goal  status: INITIAL ?  ?3.  Descend stairs w/o apprehension ?Baseline: Apprehensive ?Goal status: INITIAL ?  ?4.  70d R hamstring flexibility ?Baseline: 45d hamstring flexibility R ?Goal status: INITIAL ?  ?5.

## 2021-10-13 ENCOUNTER — Ambulatory Visit: Payer: BC Managed Care – PPO | Attending: Internal Medicine

## 2021-10-13 DIAGNOSIS — M94261 Chondromalacia, right knee: Secondary | ICD-10-CM | POA: Insufficient documentation

## 2021-10-13 DIAGNOSIS — R2681 Unsteadiness on feet: Secondary | ICD-10-CM | POA: Diagnosis present

## 2021-10-13 DIAGNOSIS — M25561 Pain in right knee: Secondary | ICD-10-CM | POA: Insufficient documentation

## 2021-10-13 NOTE — Therapy (Signed)
?OUTPATIENT PHYSICAL THERAPY TREATMENT NOTE ? ? ?Patient Name: Alexander Schroeder ?MRN: [id number] ?DOB:[birth year], 56 y.o., male ?Today's Date: 10/13/2021 ? ?PCP: Sheliah Hatch, MD ?REFERRING PROVIDER: Darlin Drop, DO ? ?END OF SESSION:  ? PT End of Session - 10/13/21 1610   ? ? Visit Number 4   ? Number of Visits 4   ? Date for PT Re-Evaluation 10/20/21   ? Authorization Type BCBS   ? PT Start Time 1615   ? PT Stop Time 1655   ? PT Time Calculation (min) 40 min   ? Activity Tolerance Patient tolerated treatment well   ? Behavior During Therapy [facility] for tasks assessed/performed   ? ?  ?  ? ?  ? ? ?Past Medical History:  ?Diagnosis Date  ? Anal fissure   ? Anxiety   ? Elevated PSA   ? ?Past Surgical History:  ?Procedure Laterality Date  ? WISDOM TOOTH EXTRACTION    ? age 56  ? ?Patient Active Problem List  ? Diagnosis Date Noted  ? Vertigo 09/02/2021  ? Constipation 09/02/2021  ? Syncope 08/25/2021  ? Overweight (BMI 25.0-29.9) 10/07/2020  ? Hyperlipidemia 10/07/2020  ? Elevated BP without diagnosis of hypertension 10/07/2020  ? Anxiety 05/25/2012  ? Insomnia 05/25/2012  ? Physical exam 12/16/2010  ? Acute pain of right knee 11/02/2009  ? ? ?REFERRING DIAG: R55 (ICD-10-CM) - Syncope, unspecified syncope type  ? ?THERAPY DIAG: R knee pain ? ? ?PERTINENT HISTORY: Hospital f/u- pt was admitted 3/15-17 after a syncopal episode while running.  He had not eaten breakfast that day and had had very little water.  Had a similar syncopal episode while running a half marathon in the past.  While running, he felt a pop in his R knee.  He passed out after that while trying to get back to office.  CT of head WNL.  MRI brain WNL.  + orthostasis- resolved w/ IVF.  Pt was seen by Cards and cardiac syncope was ruled out.  ECHO WNL. ?  ?R knee xray was unrevealing.  MRI showed degeneration of medial and lateral menisci, severe cartilage loss and joint effusion.  Pt reports knee is still sore- particularly at night.  Is able to bear  weight.  Has some difficulty straightening leg. ? ?PRECAUTIONS: Fall ? ?SUBJECTIVE: Has noted an increase in symptoms since increasing activity.  5/10 pain mainly confined to posterior knee.  Denies anterior or retropatellar pain.  Still apprehensive about stairs and negotiates sideways at times due to low confidence in knee. ? ?PAIN:  ?Are you having pain? No ? ? ? ? ? ?OBJECTIVE:  ?  ?DIAGNOSTIC FINDINGS: EXAM: ?MRI OF THE RIGHT KNEE WITHOUT CONTRAST ?  ?TECHNIQUE: ?Multiplanar, multisequence MR imaging of the knee was performed. No ?intravenous contrast was administered. ?  ?COMPARISON:  Right knee radiograph 08/25/2021 ?  ?FINDINGS: ?MENISCI ?  ?Medial: Intrasubstance degeneration of the posterior horn and body ?of the medial meniscus. Undersurface fraying of the posterior root. ?No definitive tear. ?  ?Lateral: Intrasubstance degeneration of the posterior horn and body ?of the lateral meniscus. No definitive tear. ?  ?LIGAMENTS ?  ?Cruciates: ACL and PCL are intact. ?  ?Collaterals: Medial collateral ligament is intact. Lateral ?collateral ligament complex is intact. ?  ?CARTILAGE ?  ?Patellofemoral: Moderate severe chondrosis with high-grade, ?essentially full-thickness cartilage loss along the lateral patellar ?facet and lateral trochlea. Associated marrow edema. ?  ?Medial:  No chondral defect. ?  ?Lateral:  Mild chondrosis. ?  ?  JOINT: Large joint effusion. ?  ?POPLITEAL FOSSA: No Baker's cyst. ?  ?EXTENSOR MECHANISM: Intact quadriceps tendon. Intact patellar ?tendon. ?  ?BONES: No aggressive osseous lesion. No fracture or dislocation. ?Lateral and patellofemoral osteophyte formation. ?  ?Other: No fluid collection or hematoma. Muscles are normal. ?  ?IMPRESSION: ?Intrasubstance degeneration in the posterior horn and body of the ?medial and lateral menisci. Undersurface fraying at the medial ?posterior root. No definitive meniscus tear. ?  ?Patellofemoral predominant osteoarthritis with severe cartilage  loss ?along the lateral patellar facet and lateral trochlea. ?  ?Large joint effusion. ?  ?PATIENT SURVEYS:  ?FOTO 97 out of 56 ?  ?COGNITION: ?          Overall cognitive status: Within functional limits for tasks assessed               ?           ?SENSATION: ?WFL ?  ?MUSCLE LENGTH: ?Hamstrings: Right 45 deg; Left 60 deg; 10/13/21 70d B ?  ?POSTURE:  ?WNL ?  ?PALPATION: ?Retropatellar crepitus ?  ?LE ROM: ?  ?Active ROM Right ?09/22/2021 Left ?09/22/2021  ?Hip flexion [facility] WFL  ?Hip extension [facility] WFL  ?Hip abduction      ?Hip adduction      ?Hip internal rotation      ?Hip external rotation      ?Knee flexion [facility] WFL  ?Knee extension [facility] WFL  ?Ankle dorsiflexion      ?Ankle plantarflexion      ?Ankle inversion      ?Ankle eversion      ? (Blank rows = not tested) ?  ?LE MMT: ?  ?MMT Right ?09/22/2021 Left ?09/22/2021 R ?10/13/21  ?Hip flexion 5 5   ?Hip extension       ?Hip abduction 5 5   ?Hip adduction       ?Hip internal rotation       ?Hip external rotation       ?Knee flexion 4+ 5 5-  ?Knee extension 4+ 5 5-  ?Ankle dorsiflexion       ?Ankle plantarflexion       ?Ankle inversion       ?Ankle eversion       ? (Blank rows = not tested) ?  ?LOWER EXTREMITY SPECIAL TESTS:  ?Knee special tests: McMurray's test: negative, Patellafemoral apprehension test: positive , Patellafemoral grind test: positive , and Patella tap test (ballotable patella): negative ?  ?FUNCTIONAL TESTS:  ?5 times sit to stand: 10s ?  ?GAIT: ?Distance walked: 50ft x2 ?Assistive device utilized: None ?Level of assistance: Complete Independence ?Comments: normal cadence ?  ?  ?  ?TODAY'S TREATMENT: ?[facility] Adult PT Treatment:                                                DATE: 10/13/21 ?Therapeutic Exercise: ?Nustep 8 min L5 ?Step downs 2" unable due to medial knee pain ? ?[facility] Adult PT Treatment:                                                DATE: 10/05/21 ?Therapeutic Exercise: ?Nustep 8 min L5 ?R ITB stretch 30s x3 (cross leg) ?SAQs R 30x 5# ?SLR R 5#  2x15 ?R hamstring stretch 30s x3 ?Single leg bridge R 15x2 ?LAQs with ball squeeze 30x 5# ?Reverse step downs R 15x 6" ?Step downs 2" 15x2 ?SL heel raise against wall 15x2 ?B toe raise against wall 30x ?TKEs RTB 2x15 ? ?[facility] Adult PT Treatment:                                                DATE: 09/29/21 ?Therapeutic Exercise: ?Nustep 8 min L4 ?R ITB stretch 30s x3 ?SAQs R 30x ?R clams 30x ?R abduction 30x ?SLR 30x ?QSs R 30x ?R hamstring stretch 30s x3 ?Single leg bridge B 15/15 ?LAQs with ball squeeze 30x ?Reverse step downs R 15x 6" ? ?  ?  ?PATIENT EDUCATION:  ?Education details: Discussed eval findings, rehab rationale and POC and patient is in agreement  ?Person educated: Patient ?Education method: Explanation and Handouts ?Education comprehension: verbalized understanding, returned demonstration, and needs further education ?  ?  ?HOME EXERCISE PROGRAM: ?Access Code: [email or web address] ?URL: [email or web address] ?Date: 09/29/2021 ?Prepared by: Gustavus BryantJeffrey Armilda Vanderlinden ? ?Exercises ?- Sidelying Hip Abduction  - 2 x daily - 7 x weekly - 2 sets - 15 reps ?- Supine Active Straight Leg Raise  - 2 x daily - 7 x weekly - 2 sets - 15 reps ?- Supine ITB Stretch  - 2 x daily - 7 x weekly - 1 sets - 3 reps - 30s hold ?- Figure 4 Bridge  - 2 x daily - 7 x weekly - 1 sets - 15 reps ?- Supine Quad Set  - 2 x daily - 7 x weekly - 2 sets - 15 reps ?  ?ASSESSMENT: ?  ?CLINICAL IMPRESSION: ?Returns with increased knee symptom related to increased activity.  Positive bounce test for trace effusion, stable ligamentously and no distinct meniscal signs detected, positive patellar grind noted, negative ITB tightness, step downs elicit R anteromedial symptoms ?  ?OBJECTIVE IMPAIRMENTS decreased activity tolerance, decreased strength, increased fascial restrictions, impaired flexibility, and pain.  ?  ?ACTIVITY LIMITATIONS community activity and running .  ?  ?PERSONAL FACTORS Fitness are also affecting patient's functional outcome.  ?  ?   ?REHAB POTENTIAL: Good ?  ?CLINICAL DECISION MAKING: Stable/uncomplicated ?  ?EVALUATION COMPLEXITY: Low ?  ?  ?GOALS: ?Goals reviewed with patient? Yes ?  ?SHORT TERM GOALS: STGs=LTGs ?  ?  ?LONG TERM GOA

## 2021-10-15 ENCOUNTER — Ambulatory Visit (INDEPENDENT_AMBULATORY_CARE_PROVIDER_SITE_OTHER): Payer: BC Managed Care – PPO | Admitting: Family Medicine

## 2021-10-15 ENCOUNTER — Encounter: Payer: Self-pay | Admitting: Family Medicine

## 2021-10-15 VITALS — BP 126/80 | HR 62 | Temp 98.1°F | Resp 17 | Ht 66.0 in | Wt 174.2 lb

## 2021-10-15 DIAGNOSIS — R432 Parageusia: Secondary | ICD-10-CM | POA: Diagnosis not present

## 2021-10-15 DIAGNOSIS — R42 Dizziness and giddiness: Secondary | ICD-10-CM | POA: Diagnosis not present

## 2021-10-15 DIAGNOSIS — R030 Elevated blood-pressure reading, without diagnosis of hypertension: Secondary | ICD-10-CM

## 2021-10-15 NOTE — Patient Instructions (Signed)
Follow up as needed or as scheduled ?Your blood pressure looks GREAT!!  No medication needed ?To improve your taste, suck on an ice cube for at least 1 minute before eating ?Try smelling very strong scents- citrus, garlic, cilantro, coffee ?Keep up the good work!  You look great! ?Call with any questions or concerns ?Have a great summer!!! ?

## 2021-10-15 NOTE — Assessment & Plan Note (Signed)
Nearly resolved.  Pt reports he only has sxs when lying flat and this is short lived and mild in comparison to what it was before.  No further work up at this time. ?

## 2021-10-15 NOTE — Progress Notes (Signed)
? ?  Subjective:  ? ? Patient ID: Alexander Schroeder, male    DOB: Oct 17, 1965, 56 y.o.   MRN: [id number] ? ?HPI ?Elevated BP w/o HTN- at last visit pt was told to stop Amlodipine b/c I was fearful his BP would drop low.  Particularly in the setting of a recent syncopal episode.  Today's BP 126/80 w/o medication.  No recent syncopal episodes, vertigo has substantially improved.  Will still have some dizziness when lying flat.  No HAs.  Bump on head has resolved and is no longer tender.  ? ?Abnormal taste- pt reports his taste has been altered since his injury.  Decreased sense of smell.  Pt reports both have improved since the fall but remain different from prior to his injury   ? ? ?Review of Systems ?For ROS see HPI  ?   ?Objective:  ? Physical Exam ?Vitals reviewed.  ?Constitutional:   ?   General: He is not in acute distress. ?   Appearance: Normal appearance. He is not ill-appearing.  ?HENT:  ?   Head: Normocephalic and atraumatic.  ?Eyes:  ?   Extraocular Movements: Extraocular movements intact.  ?   Conjunctiva/sclera: Conjunctivae normal.  ?   Pupils: Pupils are equal, round, and reactive to light.  ?Cardiovascular:  ?   Rate and Rhythm: Normal rate and regular rhythm.  ?Pulmonary:  ?   Effort: Pulmonary effort is normal. No respiratory distress.  ?   Breath sounds: Normal breath sounds.  ?Skin: ?   General: Skin is warm and dry.  ?Neurological:  ?   General: No focal deficit present.  ?   Mental Status: He is alert and oriented to person, place, and time.  ?   Cranial Nerves: No cranial nerve deficit.  ?   Motor: No weakness.  ?   Gait: Gait normal.  ?Psychiatric:     ?   Mood and Affect: Mood normal.     ?   Behavior: Behavior normal.     ?   Thought Content: Thought content normal.  ? ? ? ? ? ?   ?Assessment & Plan:  ? ? ?

## 2021-10-15 NOTE — Assessment & Plan Note (Signed)
Resolved.  BP is well controlled today w/o medication.  Asymptomatic.  Will continue to follow at future visits but no evidence of HTN. ?

## 2021-10-15 NOTE — Assessment & Plan Note (Signed)
New.  Pt reports since his fall and subsequent concussion he has had altered taste and smell.  He reports this is improving w/ time but it is still not normal.  Reviewed that this can take anywhere from 6-12 months.  Reviewed little tips to try and improve taste/smell faster.  Pt agreeable to try ?

## 2021-10-20 ENCOUNTER — Encounter: Payer: Self-pay | Admitting: Family Medicine

## 2021-10-21 NOTE — Telephone Encounter (Signed)
Patient has been called and we have scheduled him an appointment for 2nd shingles shot ?

## 2021-10-28 ENCOUNTER — Ambulatory Visit: Payer: BC Managed Care – PPO

## 2021-10-28 DIAGNOSIS — R2681 Unsteadiness on feet: Secondary | ICD-10-CM

## 2021-10-28 DIAGNOSIS — M94261 Chondromalacia, right knee: Secondary | ICD-10-CM

## 2021-10-28 DIAGNOSIS — M25561 Pain in right knee: Secondary | ICD-10-CM

## 2021-10-28 NOTE — Therapy (Signed)
OUTPATIENT PHYSICAL THERAPY TREATMENT NOTE   Patient Name: Alexander Schroeder MRN: [id number] DOB:10-Jun-1966, 56 y.o., male Today's Date: 10/28/2021  PCP: Midge Minium, MD REFERRING PROVIDER: Kayleen Memos, DO  END OF SESSION:   PT End of Session - 10/28/21 1619     Visit Number 5    Number of Visits 8    Date for PT Re-Evaluation 10/20/21    Authorization Type BCBS    PT Start Time 1620    PT Stop Time 1700    PT Time Calculation (min) 40 min    Activity Tolerance Patient tolerated treatment well    Behavior During Therapy [facility] for tasks assessed/performed              Past Medical History:  Diagnosis Date   Anal fissure    Anxiety    Elevated PSA    Past Surgical History:  Procedure Laterality Date   WISDOM TOOTH EXTRACTION     age 65   Patient Active Problem List   Diagnosis Date Noted   Altered taste 10/15/2021   Vertigo 09/02/2021   Constipation 09/02/2021   Syncope 08/25/2021   Overweight (BMI 25.0-29.9) 10/07/2020   Hyperlipidemia 10/07/2020   Elevated BP without diagnosis of hypertension 10/07/2020   Anxiety 05/25/2012   Insomnia 05/25/2012   Physical exam 12/16/2010    REFERRING DIAG: R55 (ICD-10-CM) - Syncope, unspecified syncope type   THERAPY DIAG: R knee pain   PERTINENT HISTORY: Hospital f/u- pt was admitted 3/15-17 after a syncopal episode while running.  He had not eaten breakfast that day and had had very little water.  Had a similar syncopal episode while running a half marathon in the past.  While running, he felt a pop in his R knee.  He passed out after that while trying to get back to office.  CT of head WNL.  MRI brain WNL.  + orthostasis- resolved w/ IVF.  Pt was seen by Cards and cardiac syncope was ruled out.  ECHO WNL.   R knee xray was unrevealing.  MRI showed degeneration of medial and lateral menisci, severe cartilage loss and joint effusion.  Pt reports knee is still sore- particularly at night.  Is able to bear weight.   Has some difficulty straightening leg.  PRECAUTIONS: Fall  SUBJECTIVE: Symptoms still present but less overall, has been able to return to modified running.  Rates knee function at 75%.  Wishes to continue strengthening. routine  PAIN:  Are you having pain? No      OBJECTIVE:    DIAGNOSTIC FINDINGS: EXAM: MRI OF THE RIGHT KNEE WITHOUT CONTRAST   TECHNIQUE: Multiplanar, multisequence MR imaging of the knee was performed. No intravenous contrast was administered.   COMPARISON:  Right knee radiograph 08/25/2021   FINDINGS: MENISCI   Medial: Intrasubstance degeneration of the posterior horn and body of the medial meniscus. Undersurface fraying of the posterior root. No definitive tear.   Lateral: Intrasubstance degeneration of the posterior horn and body of the lateral meniscus. No definitive tear.   LIGAMENTS   Cruciates: ACL and PCL are intact.   Collaterals: Medial collateral ligament is intact. Lateral collateral ligament complex is intact.   CARTILAGE   Patellofemoral: Moderate severe chondrosis with high-grade, essentially full-thickness cartilage loss along the lateral patellar facet and lateral trochlea. Associated marrow edema.   Medial:  No chondral defect.   Lateral:  Mild chondrosis.   JOINT: Large joint effusion.   POPLITEAL FOSSA: No Baker's cyst.   EXTENSOR MECHANISM:  Intact quadriceps tendon. Intact patellar tendon.   BONES: No aggressive osseous lesion. No fracture or dislocation. Lateral and patellofemoral osteophyte formation.   Other: No fluid collection or hematoma. Muscles are normal.   IMPRESSION: Intrasubstance degeneration in the posterior horn and body of the medial and lateral menisci. Undersurface fraying at the medial posterior root. No definitive meniscus tear.   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   Large joint effusion.   PATIENT  SURVEYS:  FOTO 65 out of 50   COGNITION:           Overall cognitive status: Within functional limits for tasks assessed                          SENSATION: WFL   MUSCLE LENGTH: Hamstrings: Right 45 deg; Left 60 deg; 10/13/21 70d B   POSTURE:  WNL   PALPATION: Retropatellar crepitus   LE ROM:   Active ROM Right 09/22/2021 Left 09/22/2021  Hip flexion [facility]  Hip extension [facility]  Hip abduction      Hip adduction      Hip internal rotation      Hip external rotation      Knee flexion [facility]  Knee extension [facility]  Ankle dorsiflexion      Ankle plantarflexion      Ankle inversion      Ankle eversion       (Blank rows = not tested)   LE MMT:   MMT Right 09/22/2021 Left 09/22/2021 R 10/13/21  Hip flexion 5 5   Hip extension       Hip abduction 5 5   Hip adduction       Hip internal rotation       Hip external rotation       Knee flexion 4+ 5 5-  Knee extension 4+ 5 5-  Ankle dorsiflexion       Ankle plantarflexion       Ankle inversion       Ankle eversion        (Blank rows = not tested)   LOWER EXTREMITY SPECIAL TESTS:  Knee special tests: McMurray's test: negative, Patellafemoral apprehension test: positive , Patellafemoral grind test: positive , and Patella tap test (ballotable patella): negative   FUNCTIONAL TESTS:  5 times sit to stand: 10s   GAIT: Distance walked: 65f x2 Assistive device utilized: None Level of assistance: Complete Independence Comments: normal cadence       TODAY'S TREATMENT: [facility] Adult PT Treatment:                                                DATE: 10/28/21 Therapeutic Exercise: Nustep L6 8 min Step ups 6" 15x R Lateral steps 6" 15x R Step downs 2" 15x R Runner step 6" 51/15 SLR R 15x 7 1/2# FAQs with ball squeeze 30x Lunge onto bolster 15/15  [facility]                                                DATE: 10[facility]13/21 Therapeutic Exercise: Nustep 8 min L5 Step downs 2" unable due to  medial knee  pain  [facility] Adult PT Treatment:                                                DATE: 10/05/21 Therapeutic Exercise: Nustep 8 min L5 R ITB stretch 30s x3 (cross leg) SAQs R 30x 5# SLR R 5# 2x15 R hamstring stretch 30s x3 Single leg bridge R 15x2 LAQs with ball squeeze 30x 5# Reverse step downs R 15x 6" Step downs 2" 15x2 SL heel raise against wall 15x2 B toe raise against wall 30x TKEs RTB 2x15  [facility] Adult PT Treatment:                                                DATE: 09/29/21 Therapeutic Exercise: Nustep 8 min L4 R ITB stretch 30s x3 SAQs R 30x R clams 30x R abduction 30x SLR 30x QSs R 30x R hamstring stretch 30s x3 Single leg bridge B 15/15 LAQs with ball squeeze 30x Reverse step downs R 15x 6"      PATIENT EDUCATION:  Education details: Discussed eval findings, rehab rationale and POC and patient is in agreement  Person educated: Patient Education method: Explanation and Handouts Education comprehension: verbalized understanding, returned demonstration, and needs further education     HOME EXERCISE PROGRAM: Access Code: [email or web address] URL: [email or web address] Date: 09/29/2021 Prepared by: Sharlynn Oliphant  Exercises - Sidelying Hip Abduction  - 2 x daily - 7 x weekly - 2 sets - 15 reps - Supine Active Straight Leg Raise  - 2 x daily - 7 x weekly - 2 sets - 15 reps - Supine ITB Stretch  - 2 x daily - 7 x weekly - 1 sets - 3 reps - 30s hold - Figure 4 Bridge  - 2 x daily - 7 x weekly - 1 sets - 15 reps - Supine Quad Set  - 2 x daily - 7 x weekly - 2 sets - 15 reps   ASSESSMENT:   CLINICAL IMPRESSION: Symptoms have lessened and he has returned to modified running.  Knee stiff with prolonged positioning, loosens with activity, advanced to CKC tasks, stepping, runner step and step downs.  Ligamentously stable with possible ACL laxity.   OBJECTIVE IMPAIRMENTS decreased activity tolerance, decreased strength, increased fascial restrictions, impaired  flexibility, and pain.    ACTIVITY LIMITATIONS community activity and running .    PERSONAL FACTORS Fitness are also affecting patient's functional outcome.      REHAB POTENTIAL: Good   CLINICAL DECISION MAKING: Stable/uncomplicated   EVALUATION COMPLEXITY: Low     GOALS: Goals reviewed with patient? Yes   SHORT TERM GOALS: STGs=LTGs     LONG TERM GOALS: Target date: 11/20/2021   Patient to demonstrate independence in HEP  Baseline:  [id number] Goal status: Met   2.  5/5 R knee strength F/E Baseline: 4+/5 R knee strength in F/E; 10/13/21 5-/5 R knee F/E strength  Goal status: Ongoing   3.  Descend stairs w/o apprehension Baseline: Apprehensive Goal status: Ongoing   4.  70d R hamstring flexibility Baseline: 45d hamstring flexibility R; 70d R hamstring length Goal status: Met   5. Increase FOTO score to 78  Baseline: 65; 10/13/21 FOTO 73            Goal status: Ongoing     PLAN: PT FREQUENCY: 1x/week   PT DURATION: 4   PLANNED INTERVENTIONS: Therapeutic exercises, Therapeutic activity, Neuromuscular re-education, Balance training, Gait training, Patient/Family education, Joint mobilization, Stair training, and Manual therapy   PLAN FOR NEXT SESSION: extend OPPT 3 more sessions     Lanice Shirts, PT 10/28/2021, 4:22 PM

## 2021-10-29 ENCOUNTER — Ambulatory Visit (INDEPENDENT_AMBULATORY_CARE_PROVIDER_SITE_OTHER): Payer: BC Managed Care – PPO | Admitting: Family Medicine

## 2021-10-29 DIAGNOSIS — Z23 Encounter for immunization: Secondary | ICD-10-CM

## 2021-10-29 NOTE — Progress Notes (Signed)
Alexander Schroeder is a 56 y.o. male presents to the office today for Shingrix injection. Tolerated well .  Rockney Ghee

## 2021-10-29 NOTE — Patient Instructions (Signed)
Gave pt Shingrix injection in right deltoid . Tolerated well

## 2021-11-02 ENCOUNTER — Ambulatory Visit: Payer: BC Managed Care – PPO

## 2021-11-09 ENCOUNTER — Ambulatory Visit: Payer: BC Managed Care – PPO

## 2021-11-09 DIAGNOSIS — R2681 Unsteadiness on feet: Secondary | ICD-10-CM | POA: Diagnosis not present

## 2021-11-09 DIAGNOSIS — M25561 Pain in right knee: Secondary | ICD-10-CM

## 2021-11-09 DIAGNOSIS — M94261 Chondromalacia, right knee: Secondary | ICD-10-CM

## 2021-11-09 NOTE — Therapy (Signed)
OUTPATIENT PHYSICAL THERAPY TREATMENT NOTE   Patient Name: Alexander Schroeder MRN: [id number] DOB:11/25/1965, 56 y.o., male Today's Date: 11/09/2021  PCP: Midge Minium, MD REFERRING PROVIDER: Kayleen Memos, DO  END OF SESSION:   PT End of Session - 11/09/21 1701     Visit Number 6    Number of Visits 8    Date for PT Re-Evaluation 10/20/21    Authorization Type BCBS    PT Start Time 1700    PT Stop Time 1740    PT Time Calculation (min) 40 min    Activity Tolerance Patient tolerated treatment well    Behavior During Therapy [facility] for tasks assessed/performed              Past Medical History:  Diagnosis Date   Anal fissure    Anxiety    Elevated PSA    Past Surgical History:  Procedure Laterality Date   WISDOM TOOTH EXTRACTION     age 37   Patient Active Problem List   Diagnosis Date Noted   Altered taste 10/15/2021   Vertigo 09/02/2021   Constipation 09/02/2021   Syncope 08/25/2021   Overweight (BMI 25.0-29.9) 10/07/2020   Hyperlipidemia 10/07/2020   Elevated BP without diagnosis of hypertension 10/07/2020   Anxiety 05/25/2012   Insomnia 05/25/2012   Physical exam 12/16/2010    REFERRING DIAG: R55 (ICD-10-CM) - Syncope, unspecified syncope type   THERAPY DIAG: R knee pain   PERTINENT HISTORY: Hospital f/u- pt was admitted 3/15-17 after a syncopal episode while running.  He had not eaten breakfast that day and had had very little water.  Had a similar syncopal episode while running a half marathon in the past.  While running, he felt a pop in his R knee.  He passed out after that while trying to get back to office.  CT of head WNL.  MRI brain WNL.  + orthostasis- resolved w/ IVF.  Pt was seen by Cards and cardiac syncope was ruled out.  ECHO WNL.   R knee xray was unrevealing.  MRI showed degeneration of medial and lateral menisci, severe cartilage loss and joint effusion.  Pt reports knee is still sore- particularly at night.  Is able to bear weight.   Has some difficulty straightening leg.  PRECAUTIONS: Fall  SUBJECTIVE: R knee feeling better, has been able to run w/o incident, wears knee sleeve running for added confidence. Still cites symptoms to distal medial hamstring tendons.  PAIN:  Are you having pain? No      OBJECTIVE:    DIAGNOSTIC FINDINGS: EXAM: MRI OF THE RIGHT KNEE WITHOUT CONTRAST   TECHNIQUE: Multiplanar, multisequence MR imaging of the knee was performed. No intravenous contrast was administered.   COMPARISON:  Right knee radiograph 08/25/2021   FINDINGS: MENISCI   Medial: Intrasubstance degeneration of the posterior horn and body of the medial meniscus. Undersurface fraying of the posterior root. No definitive tear.   Lateral: Intrasubstance degeneration of the posterior horn and body of the lateral meniscus. No definitive tear.   LIGAMENTS   Cruciates: ACL and PCL are intact.   Collaterals: Medial collateral ligament is intact. Lateral collateral ligament complex is intact.   CARTILAGE   Patellofemoral: Moderate severe chondrosis with high-grade, essentially full-thickness cartilage loss along the lateral patellar facet and lateral trochlea. Associated marrow edema.   Medial:  No chondral defect.   Lateral:  Mild chondrosis.   JOINT: Large joint effusion.   POPLITEAL FOSSA: No Baker's cyst.   EXTENSOR MECHANISM:  Intact quadriceps tendon. Intact patellar tendon.   BONES: No aggressive osseous lesion. No fracture or dislocation. Lateral and patellofemoral osteophyte formation.   Other: No fluid collection or hematoma. Muscles are normal.   IMPRESSION: Intrasubstance degeneration in the posterior horn and body of the medial and lateral menisci. Undersurface fraying at the medial posterior root. No definitive meniscus tear.   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   Large joint effusion.    PATIENT SURVEYS:  FOTO 65 out of 65   COGNITION:           Overall cognitive status: Within functional limits for tasks assessed                          SENSATION: WFL   MUSCLE LENGTH: Hamstrings: Right 45 deg; Left 60 deg; 10/13/21 70d B   POSTURE:  WNL   PALPATION: Retropatellar crepitus   LE ROM:   Active ROM Right 09/22/2021 Left 09/22/2021  Hip flexion [facility]  Hip extension [facility]  Hip abduction      Hip adduction      Hip internal rotation      Hip external rotation      Knee flexion [facility]  Knee extension [facility]  Ankle dorsiflexion      Ankle plantarflexion      Ankle inversion      Ankle eversion       (Blank rows = not tested)   LE MMT:   MMT Right 09/22/2021 Left 09/22/2021 R 10/13/21  Hip flexion 5 5   Hip extension       Hip abduction 5 5   Hip adduction       Hip internal rotation       Hip external rotation       Knee flexion 4+ 5 5-  Knee extension 4+ 5 5-  Ankle dorsiflexion       Ankle plantarflexion       Ankle inversion       Ankle eversion        (Blank rows = not tested)   LOWER EXTREMITY SPECIAL TESTS:  Knee special tests: McMurray's test: negative, Patellafemoral apprehension test: positive , Patellafemoral grind test: positive , and Patella tap test (ballotable patella): negative   FUNCTIONAL TESTS:  5 times sit to stand: 10s   GAIT: Distance walked: 72ft x2 Assistive device utilized: None Level of assistance: Complete Independence Comments: normal cadence       TODAY'S TREATMENT: [facility] Adult PT Treatment:                                                DATE: 11/09/21 Therapeutic Exercise: Elliptical 8 min SAQs R 30x 7.5# SLR R 7.5# 2x15 Single leg bridge R 15x2 LAQs with ball squeeze 30x 7.5# Reverse step downs R 15x 6" with airex pad Step downs 2" 15x2 4 way cable walk 13# 5x each direction    [facility] Adult PT Treatment:                                                DATE: 10/28/21 Therapeutic Exercise: Nustep L6  8 min Step ups 6" 15x R Lateral steps 6" 15x R Step downs 2" 15x R Runner step 6" 51/15 SLR R 15x 7 1/2# FAQs with ball squeeze 30x Lunge onto bolster 15/15  [facility] Adult PT Treatment:                                                DATE: 10/13/21 Therapeutic Exercise: Nustep 8 min L5 Step downs 2" unable due to medial knee pain       PATIENT EDUCATION:  Education details: Discussed eval findings, rehab rationale and POC and patient is in agreement  Person educated: Patient Education method: Explanation and Handouts Education comprehension: verbalized understanding, returned demonstration, and needs further education     HOME EXERCISE PROGRAM: Access Code: [email or web address] URL: [email or web address] Date: 09/29/2021 Prepared by: Sharlynn Oliphant  Exercises - Sidelying Hip Abduction  - 2 x daily - 7 x weekly - 2 sets - 15 reps - Supine Active Straight Leg Raise  - 2 x daily - 7 x weekly - 2 sets - 15 reps - Supine ITB Stretch  - 2 x daily - 7 x weekly - 1 sets - 3 reps - 30s hold - Figure 4 Bridge  - 2 x daily - 7 x weekly - 1 sets - 15 reps - Supine Quad Set  - 2 x daily - 7 x weekly - 2 sets - 15 reps   ASSESSMENT:   CLINICAL IMPRESSION: Symptoms have lessened, he has been able to return to running with good confidence.  Still reporting medial distal hamstring tendon discomfort(?ACL laxity) Added cable walks for proprioception and resisted lateral tasks.  No symptoms reported with new tasks, showing confidence in R knee   OBJECTIVE IMPAIRMENTS decreased activity tolerance, decreased strength, increased fascial restrictions, impaired flexibility, and pain.    ACTIVITY LIMITATIONS community activity and running .    PERSONAL FACTORS Fitness are also affecting patient's functional outcome.      REHAB POTENTIAL: Good   CLINICAL DECISION MAKING: Stable/uncomplicated   EVALUATION COMPLEXITY: Low     GOALS: Goals reviewed with patient? Yes   SHORT TERM GOALS:  STGs=LTGs     LONG TERM GOALS: Target date: 11/20/2021   Patient to demonstrate independence in HEP  Baseline:  [id number] Goal status: Met   2.  5/5 R knee strength F/E Baseline: 4+/5 R knee strength in F/E; 10/13/21 5-/5 R knee F/E strength  Goal status: Ongoing   3.  Descend stairs w/o apprehension Baseline: Apprehensive Goal status: Ongoing   4.  70d R hamstring flexibility Baseline: 45d hamstring flexibility R; 70d R hamstring length Goal status: Met   5. Increase FOTO score to 78            Baseline: 65; 10/13/21 FOTO 73            Goal status: Ongoing     PLAN: PT FREQUENCY: 1x/week   PT DURATION: 4   PLANNED INTERVENTIONS: Therapeutic exercises, Therapeutic activity, Neuromuscular re-education, Balance training, Gait training, Patient/Family education, Joint mobilization, Stair training, and Manual therapy   PLAN FOR NEXT SESSION: continue R knee strengthening and stabilizing tasks, focus on eccentrics    Lanice Shirts, PT 11/09/2021, 5:09 PM

## 2021-11-16 ENCOUNTER — Ambulatory Visit: Payer: BC Managed Care – PPO | Attending: Internal Medicine

## 2021-11-16 DIAGNOSIS — M25561 Pain in right knee: Secondary | ICD-10-CM | POA: Diagnosis present

## 2021-11-16 DIAGNOSIS — M94261 Chondromalacia, right knee: Secondary | ICD-10-CM | POA: Diagnosis present

## 2021-11-16 DIAGNOSIS — R2681 Unsteadiness on feet: Secondary | ICD-10-CM

## 2021-11-16 NOTE — Therapy (Signed)
OUTPATIENT PHYSICAL THERAPY TREATMENT NOTE   Patient Name: Alexander Schroeder MRN: [id number] DOB:1965/07/29, 56 y.o., male Today's Date: 11/16/2021  PCP: Midge Minium, MD REFERRING PROVIDER: Kayleen Memos, DO  END OF SESSION:   PT End of Session - 11/16/21 1701     Visit Number 7    Number of Visits 8    Date for PT Re-Evaluation 10/20/21    Authorization Type BCBS    PT Start Time 1700    PT Stop Time 1740    PT Time Calculation (min) 40 min    Activity Tolerance Patient tolerated treatment well    Behavior During Therapy [facility] for tasks assessed/performed              Past Medical History:  Diagnosis Date   Anal fissure    Anxiety    Elevated PSA    Past Surgical History:  Procedure Laterality Date   WISDOM TOOTH EXTRACTION     age 6   Patient Active Problem List   Diagnosis Date Noted   Altered taste 10/15/2021   Vertigo 09/02/2021   Constipation 09/02/2021   Syncope 08/25/2021   Overweight (BMI 25.0-29.9) 10/07/2020   Hyperlipidemia 10/07/2020   Elevated BP without diagnosis of hypertension 10/07/2020   Anxiety 05/25/2012   Insomnia 05/25/2012   Physical exam 12/16/2010    REFERRING DIAG: R55 (ICD-10-CM) - Syncope, unspecified syncope type   THERAPY DIAG: R knee pain   PERTINENT HISTORY: Hospital f/u- pt was admitted 3/15-17 after a syncopal episode while running.  He had not eaten breakfast that day and had had very little water.  Had a similar syncopal episode while running a half marathon in the past.  While running, he felt a pop in his R knee.  He passed out after that while trying to get back to office.  CT of head WNL.  MRI brain WNL.  + orthostasis- resolved w/ IVF.  Pt was seen by Cards and cardiac syncope was ruled out.  ECHO WNL.   R knee xray was unrevealing.  MRI showed degeneration of medial and lateral menisci, severe cartilage loss and joint effusion.  Pt reports knee is still sore- particularly at night.  Is able to bear weight.   Has some difficulty straightening leg.  PRECAUTIONS: Fall  SUBJECTIVE: Symptoms slowly improving, still cautious on steps but running more and farther.  PAIN:  Are you having pain? No      OBJECTIVE:    DIAGNOSTIC FINDINGS: EXAM: MRI OF THE RIGHT KNEE WITHOUT CONTRAST   TECHNIQUE: Multiplanar, multisequence MR imaging of the knee was performed. No intravenous contrast was administered.   COMPARISON:  Right knee radiograph 08/25/2021   FINDINGS: MENISCI   Medial: Intrasubstance degeneration of the posterior horn and body of the medial meniscus. Undersurface fraying of the posterior root. No definitive tear.   Lateral: Intrasubstance degeneration of the posterior horn and body of the lateral meniscus. No definitive tear.   LIGAMENTS   Cruciates: ACL and PCL are intact.   Collaterals: Medial collateral ligament is intact. Lateral collateral ligament complex is intact.   CARTILAGE   Patellofemoral: Moderate severe chondrosis with high-grade, essentially full-thickness cartilage loss along the lateral patellar facet and lateral trochlea. Associated marrow edema.   Medial:  No chondral defect.   Lateral:  Mild chondrosis.   JOINT: Large joint effusion.   POPLITEAL FOSSA: No Baker's cyst.   EXTENSOR MECHANISM: Intact quadriceps tendon. Intact patellar tendon.   BONES: No aggressive osseous lesion. No  fracture or dislocation. Lateral and patellofemoral osteophyte formation.   Other: No fluid collection or hematoma. Muscles are normal.   IMPRESSION: Intrasubstance degeneration in the posterior horn and body of the medial and lateral menisci. Undersurface fraying at the medial posterior root. No definitive meniscus tear.   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   Large joint effusion.   PATIENT SURVEYS:  FOTO 65 out of 81   COGNITION:           Overall cognitive  status: Within functional limits for tasks assessed                          SENSATION: WFL   MUSCLE LENGTH: Hamstrings: Right 45 deg; Left 60 deg; 10/13/21 70d B   POSTURE:  WNL   PALPATION: Retropatellar crepitus   LE ROM:   Active ROM Right 09/22/2021 Left 09/22/2021  Hip flexion [facility]  Hip extension [facility]  Hip abduction      Hip adduction      Hip internal rotation      Hip external rotation      Knee flexion [facility]  Knee extension [facility]  Ankle dorsiflexion      Ankle plantarflexion      Ankle inversion      Ankle eversion       (Blank rows = not tested)   LE MMT:   MMT Right 09/22/2021 Left 09/22/2021 R 10/13/21  Hip flexion 5 5   Hip extension       Hip abduction 5 5   Hip adduction       Hip internal rotation       Hip external rotation       Knee flexion 4+ 5 5-  Knee extension 4+ 5 5-  Ankle dorsiflexion       Ankle plantarflexion       Ankle inversion       Ankle eversion        (Blank rows = not tested)   LOWER EXTREMITY SPECIAL TESTS:  Knee special tests: McMurray's test: negative, Patellafemoral apprehension test: positive , Patellafemoral grind test: positive , and Patella tap test (ballotable patella): negative   FUNCTIONAL TESTS:  5 times sit to stand: 10s   GAIT: Distance walked: 30f x2 Assistive device utilized: None Level of assistance: Complete Independence Comments: normal cadence       TODAY'S TREATMENT: [facility] Adult PT Treatment:                                                DATE: 11/16/21 Therapeutic Exercise: Retrowalk 1.5 MPH 10% grade PKB 5# 30x PHE 5# 30x Resisted golfers lift against black strap 30x 4 way cable walk 20# 5x fwd/bwd Ball squats with ball squeeze 30x  [facility] Adult PT Treatment:                                                DATE: 11/09/21 Therapeutic Exercise: Elliptical 8 min SAQs R 30x 7.5# SLR R 7.5# 2x15 Single leg bridge R 15x2 LAQs with ball squeeze 30x 7.5# Reverse step downs R 15x 6" with  airex pad Step downs 2" 15x2 4 way cable walk 13# 5x each direction    [facility] Adult PT Treatment:                                                DATE: 10/28/21 Therapeutic Exercise: Nustep L6 8 min Step ups 6" 15x R Lateral steps 6" 15x R Step downs 2" 15x R Runner step 6" 51/15 SLR R 15x 7 1/2# FAQs with ball squeeze 30x Lunge onto bolster 15/15        PATIENT EDUCATION:  Education details: Discussed eval findings, rehab rationale and POC and patient is in agreement  Person educated: Patient Education method: Explanation and Handouts Education comprehension: verbalized understanding, returned demonstration, and needs further education     HOME EXERCISE PROGRAM: Access Code: [email or web address] URL: [email or web address] Date: 09/29/2021 Prepared by: Sharlynn Oliphant  Exercises - Sidelying Hip Abduction  - 2 x daily - 7 x weekly - 2 sets - 15 reps - Supine Active Straight Leg Raise  - 2 x daily - 7 x weekly - 2 sets - 15 reps - Supine ITB Stretch  - 2 x daily - 7 x weekly - 1 sets - 3 reps - 30s hold - Figure 4 Bridge  - 2 x daily - 7 x weekly - 1 sets - 15 reps - Supine Quad Set  - 2 x daily - 7 x weekly - 2 sets - 15 reps   ASSESSMENT:   CLINICAL IMPRESSION: Increasing activity tolerance outside of clinic.  Today's session added additional strengthening tasks with focus on eccentric strengthening and hamstring activities.  Still apprehensive about descending steps with a step through pattern.  OBJECTIVE IMPAIRMENTS decreased activity tolerance, decreased strength, increased fascial restrictions, impaired flexibility, and pain.    ACTIVITY LIMITATIONS community activity and running .    PERSONAL FACTORS Fitness are also affecting patient's functional outcome.      REHAB POTENTIAL: Good   CLINICAL DECISION MAKING: Stable/uncomplicated   EVALUATION COMPLEXITY: Low     GOALS: Goals reviewed with patient? Yes   SHORT TERM GOALS: STGs=LTGs     LONG TERM  GOALS: Target date: 11/20/2021   Patient to demonstrate independence in HEP  Baseline:  [id number] Goal status: Met   2.  5/5 R knee strength F/E Baseline: 4+/5 R knee strength in F/E; 10/13/21 5-/5 R knee F/E strength  Goal status: Ongoing   3.  Descend stairs w/o apprehension Baseline: Apprehensive Goal status: Ongoing   4.  70d R hamstring flexibility Baseline: 45d hamstring flexibility R; 70d R hamstring length Goal status: Met   5. Increase FOTO score to 78            Baseline: 65; 10/13/21 FOTO 73            Goal status: Ongoing     PLAN: PT FREQUENCY: 1x/week   PT DURATION: 1   PLANNED INTERVENTIONS: Therapeutic exercises, Therapeutic activity, Neuromuscular re-education, Balance training, Gait training, Patient/Family education, Joint mobilization, Stair training, and Manual therapy   PLAN FOR NEXT SESSION: Assess for DC    Lanice Shirts, PT 11/16/2021, 5:05 PM

## 2021-11-30 ENCOUNTER — Ambulatory Visit: Payer: BC Managed Care – PPO

## 2021-11-30 DIAGNOSIS — M94261 Chondromalacia, right knee: Secondary | ICD-10-CM

## 2021-11-30 DIAGNOSIS — M25561 Pain in right knee: Secondary | ICD-10-CM

## 2021-11-30 DIAGNOSIS — R2681 Unsteadiness on feet: Secondary | ICD-10-CM

## 2021-11-30 NOTE — Therapy (Signed)
OUTPATIENT PHYSICAL THERAPY TREATMENT NOTE/DC SUMMARY   Patient Name: Alexander Schroeder MRN: [id number] DOB:10/26/1965, 56 y.o., male Today's Date: 11/30/2021  PCP: Midge Minium, MD REFERRING PROVIDER: Midge Minium, MD PHYSICAL THERAPY DISCHARGE SUMMARY  Visits from Start of Care: 8  Current functional level related to goals / functional outcomes: Maximum function regained at this time   Remaining deficits: Pain with running   Education / Equipment: HEP   Patient agrees to discharge. Patient goals were partially met. Patient is being discharged due to being pleased with the current functional level.  END OF SESSION:   PT End of Session - 11/30/21 1614     Visit Number 8    Number of Visits 8    Date for PT Re-Evaluation 10/20/21    Authorization Type BCBS    PT Start Time 1615    PT Stop Time 1655    PT Time Calculation (min) 40 min    Activity Tolerance Patient tolerated treatment well    Behavior During Therapy WFL for tasks assessed/performed               Past Medical History:  Diagnosis Date   Anal fissure    Anxiety    Elevated PSA    Past Surgical History:  Procedure Laterality Date   WISDOM TOOTH EXTRACTION     age 65   Patient Active Problem List   Diagnosis Date Noted   Altered taste 10/15/2021   Vertigo 09/02/2021   Constipation 09/02/2021   Syncope 08/25/2021   Overweight (BMI 25.0-29.9) 10/07/2020   Hyperlipidemia 10/07/2020   Elevated BP without diagnosis of hypertension 10/07/2020   Anxiety 05/25/2012   Insomnia 05/25/2012   Physical exam 12/16/2010    REFERRING DIAG: R55 (ICD-10-CM) - Syncope, unspecified syncope type   THERAPY DIAG: R knee pain   PERTINENT HISTORY: Hospital f/u- pt was admitted 3/15-17 after a syncopal episode while running.  He had not eaten breakfast that day and had had very little water.  Had a similar syncopal episode while running a half marathon in the past.  While running, he felt a pop in  his R knee.  He passed out after that while trying to get back to office.  CT of head WNL.  MRI brain WNL.  + orthostasis- resolved w/ IVF.  Pt was seen by Cards and cardiac syncope was ruled out.  ECHO WNL.   R knee xray was unrevealing.  MRI showed degeneration of medial and lateral menisci, severe cartilage loss and joint effusion.  Pt reports knee is still sore- particularly at night.  Is able to bear weight.  Has some difficulty straightening leg.  PRECAUTIONS: Fall  SUBJECTIVE: Symptoms much improved, has returned to running but is a bit apprehensive about going all out.  Feels he is about 85-90% functionally.   PAIN:  Are you having pain? No      OBJECTIVE:    DIAGNOSTIC FINDINGS: EXAM: MRI OF THE RIGHT KNEE WITHOUT CONTRAST   TECHNIQUE: Multiplanar, multisequence MR imaging of the knee was performed. No intravenous contrast was administered.   COMPARISON:  Right knee radiograph 08/25/2021   FINDINGS: MENISCI   Medial: Intrasubstance degeneration of the posterior horn and body of the medial meniscus. Undersurface fraying of the posterior root. No definitive tear.   Lateral: Intrasubstance degeneration of the posterior horn and body of the lateral meniscus. No definitive tear.   LIGAMENTS   Cruciates: ACL and PCL are intact.   Collaterals: Medial collateral ligament  is intact. Lateral collateral ligament complex is intact.   CARTILAGE   Patellofemoral: Moderate severe chondrosis with high-grade, essentially full-thickness cartilage loss along the lateral patellar facet and lateral trochlea. Associated marrow edema.   Medial:  No chondral defect.   Lateral:  Mild chondrosis.   JOINT: Large joint effusion.   POPLITEAL FOSSA: No Baker's cyst.   EXTENSOR MECHANISM: Intact quadriceps tendon. Intact patellar tendon.   BONES: No aggressive osseous lesion. No fracture or dislocation. Lateral and patellofemoral osteophyte formation.   Other: No fluid  collection or hematoma. Muscles are normal.   IMPRESSION: Intrasubstance degeneration in the posterior horn and body of the medial and lateral menisci. Undersurface fraying at the medial posterior root. No definitive meniscus tear.   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   Large joint effusion.   PATIENT SURVEYS:  FOTO 65 out of 43   COGNITION:           Overall cognitive status: Within functional limits for tasks assessed                          SENSATION: WFL   MUSCLE LENGTH: Hamstrings: Right 45 deg; Left 60 deg; 10/13/21 70d B   POSTURE:  WNL   PALPATION: Retropatellar crepitus   LE ROM:   Active ROM Right 09/22/2021 Left 09/22/2021  Hip flexion [facility]  Hip extension [facility]  Hip abduction      Hip adduction      Hip internal rotation      Hip external rotation      Knee flexion [facility]  Knee extension [facility]  Ankle dorsiflexion      Ankle plantarflexion      Ankle inversion      Ankle eversion       (Blank rows = not tested)   LE MMT:   MMT Right 09/22/2021 Left 09/22/2021 R 10/13/21  Hip flexion 5 5   Hip extension       Hip abduction 5 5   Hip adduction       Hip internal rotation       Hip external rotation       Knee flexion 4+ 5 5-  Knee extension 4+ 5 5-  Ankle dorsiflexion       Ankle plantarflexion       Ankle inversion       Ankle eversion        (Blank rows = not tested)   LOWER EXTREMITY SPECIAL TESTS:  Knee special tests: McMurray's test: negative, Patellafemoral apprehension test: positive , Patellafemoral grind test: positive , and Patella tap test (ballotable patella): negative   FUNCTIONAL TESTS:  5 times sit to stand: 10s   GAIT: Distance walked: 34f x2 Assistive device utilized: None Level of assistance: Complete Independence Comments: normal cadence       TODAY'S TREATMENT:  [facility] Adult PT Treatment:                                                 DATE: 11/30/21 Therapeutic Exercise: Quad set R 15x SLR R 15x R TKE BluTB 15x Side plank with abduction 15x   [facility] Adult PT Treatment:  DATE: 11/16/21 Therapeutic Exercise: Retrowalk 1.5 MPH 10% grade PKB 5# 30x PHE 5# 30x Resisted golfers lift against black strap 30x 4 way cable walk 20# 5x fwd/bwd Ball squats with ball squeeze 30x  [facility] Adult PT Treatment:                                                DATE: 11/09/21 Therapeutic Exercise: Elliptical 8 min SAQs R 30x 7.5# SLR R 7.5# 2x15 Single leg bridge R 15x2 LAQs with ball squeeze 30x 7.5# Reverse step downs R 15x 6" with airex pad Step downs 2" 15x2 4 way cable walk 13# 5x each direction    [facility] Adult PT Treatment:                                                DATE: 10/28/21 Therapeutic Exercise: Nustep L6 8 min Step ups 6" 15x R Lateral steps 6" 15x R Step downs 2" 15x R Runner step 6" 51/15 SLR R 15x 7 1/2# FAQs with ball squeeze 30x Lunge onto bolster 15/15        PATIENT EDUCATION:  Education details: Discussed eval findings, rehab rationale and POC and patient is in agreement  Person educated: Patient Education method: Explanation and Handouts Education comprehension: verbalized understanding, returned demonstration, and needs further education     HOME EXERCISE PROGRAM: Access Code: [email or web address] URL: [email or web address] Date: 11/30/2021 Prepared by: Sharlynn Oliphant  Exercises - Supine Active Straight Leg Raise  - 2 x daily - 7 x weekly - 2 sets - 15 reps - Supine Quad Set  - 2 x daily - 7 x weekly - 2 sets - 15 reps - Modified Side Plank with Hip Abduction  - 2 x daily - 7 x weekly - 1 sets - 15 reps - Standing Terminal Knee Extension with Resistance  - 2 x daily - 7 x weekly - 1 sets - 15 reps   ASSESSMENT:   CLINICAL IMPRESSION: Rehab goals met maximum benefit regained at this time.  Hep revised and  reviewed  OBJECTIVE IMPAIRMENTS decreased activity tolerance, decreased strength, increased fascial restrictions, impaired flexibility, and pain.    ACTIVITY LIMITATIONS community activity and running .    PERSONAL FACTORS Fitness are also affecting patient's functional outcome.      REHAB POTENTIAL: Good   CLINICAL DECISION MAKING: Stable/uncomplicated   EVALUATION COMPLEXITY: Low     GOALS: Goals reviewed with patient? Yes   SHORT TERM GOALS: STGs=LTGs     LONG TERM GOALS: Target date: 11/20/2021   Patient to demonstrate independence in HEP  Baseline:  [id number] Goal status: Met   2.  5/5 R knee strength F/E Baseline: 4+/5 R knee strength in F/E; 10/13/21 5-/5 R knee F/E strength  Goal status: Ongoing   3.  Descend stairs w/o apprehension Baseline: Apprehensive Goal status: Partially met   4.  70d R hamstring flexibility Baseline: 45d hamstring flexibility R; 70d R hamstring length Goal status: Met   5. Increase FOTO score to 78            Baseline: 65; 10/13/21 FOTO 73; 11/30/21 88            Goal status: met     PLAN:  PT FREQUENCY: 1x/week   PT DURATION: 1   PLANNED INTERVENTIONS: Therapeutic exercises, Therapeutic activity, Neuromuscular re-education, Balance training, Gait training, Patient/Family education, Joint mobilization, Stair training, and Manual therapy   PLAN FOR NEXT SESSION: DC    Lanice Shirts, PT 11/30/2021, 4:17 PM

## 2021-12-24 ENCOUNTER — Other Ambulatory Visit: Payer: Self-pay | Admitting: Family Medicine

## 2021-12-24 NOTE — Telephone Encounter (Signed)
Patient is requesting a refill of the following medications: Requested Prescriptions   Pending Prescriptions Disp Refills   citalopram (CELEXA) 20 MG tablet [Pharmacy Med Name: citalopram 20 mg tablet] 90 tablet 1    Sig: TAKE 1 TABLET ONCE DAILY.    Date of patient request: 12/24/2021 Last office visit: 10/29/2021 Date of last refill: 06/15/2021 Last refill amount: 90 tablets 1 refill  Follow up time period per chart: 04/18/2022

## 2022-04-01 ENCOUNTER — Other Ambulatory Visit (HOSPITAL_BASED_OUTPATIENT_CLINIC_OR_DEPARTMENT_OTHER): Payer: Self-pay

## 2022-04-01 MED ORDER — INFLUENZA VAC SPLIT QUAD 0.5 ML IM SUSY
PREFILLED_SYRINGE | INTRAMUSCULAR | 0 refills | Status: DC
  Filled 2022-04-01: qty 0.5, 1d supply, fill #0

## 2022-04-01 MED ORDER — COMIRNATY 30 MCG/0.3ML IM SUSY
PREFILLED_SYRINGE | INTRAMUSCULAR | 0 refills | Status: DC
  Filled 2022-04-01: qty 0.3, 1d supply, fill #0

## 2022-04-18 ENCOUNTER — Encounter: Payer: BC Managed Care – PPO | Admitting: Family Medicine

## 2022-04-28 ENCOUNTER — Ambulatory Visit (INDEPENDENT_AMBULATORY_CARE_PROVIDER_SITE_OTHER): Payer: BC Managed Care – PPO | Admitting: Family Medicine

## 2022-04-28 ENCOUNTER — Encounter: Payer: Self-pay | Admitting: Family Medicine

## 2022-04-28 VITALS — BP 116/80 | HR 60 | Temp 97.3°F | Resp 17 | Ht 66.0 in | Wt 173.2 lb

## 2022-04-28 DIAGNOSIS — E663 Overweight: Secondary | ICD-10-CM

## 2022-04-28 DIAGNOSIS — Z125 Encounter for screening for malignant neoplasm of prostate: Secondary | ICD-10-CM

## 2022-04-28 DIAGNOSIS — M25561 Pain in right knee: Secondary | ICD-10-CM

## 2022-04-28 DIAGNOSIS — Z23 Encounter for immunization: Secondary | ICD-10-CM

## 2022-04-28 DIAGNOSIS — Z0000 Encounter for general adult medical examination without abnormal findings: Secondary | ICD-10-CM

## 2022-04-28 LAB — HEPATIC FUNCTION PANEL
ALT: 16 U/L (ref 0–53)
AST: 18 U/L (ref 0–37)
Albumin: 4.3 g/dL (ref 3.5–5.2)
Alkaline Phosphatase: 114 U/L (ref 39–117)
Bilirubin, Direct: 0.1 mg/dL (ref 0.0–0.3)
Total Bilirubin: 0.8 mg/dL (ref 0.2–1.2)
Total Protein: 7.2 g/dL (ref 6.0–8.3)

## 2022-04-28 LAB — BASIC METABOLIC PANEL
BUN: 16 mg/dL (ref 6–23)
CO2: 28 mEq/L (ref 19–32)
Calcium: 9.4 mg/dL (ref 8.4–10.5)
Chloride: 103 mEq/L (ref 96–112)
Creatinine, Ser: 1.23 mg/dL (ref 0.40–1.50)
GFR: 64.58 mL/min (ref >60.00)
Glucose, Bld: 83 mg/dL (ref 70–99)
Potassium: 4.5 mEq/L (ref 3.5–5.1)
Sodium: 139 mEq/L (ref 135–145)

## 2022-04-28 LAB — CBC WITH DIFFERENTIAL/PLATELET
Basophils Absolute: 0.1 10*3/uL (ref 0.0–0.1)
Basophils Relative: 2.4 % (ref 0.0–3.0)
Eosinophils Absolute: 0.1 10*3/uL (ref 0.0–0.7)
Eosinophils Relative: 1.2 % (ref 0.0–5.0)
HCT: 42 % (ref 39.0–52.0)
Hemoglobin: 13.4 g/dL (ref 13.0–17.0)
Lymphocytes Relative: 17.3 % (ref 12.0–46.0)
Lymphs Abs: 1 10*3/uL (ref 0.7–4.0)
MCHC: 32 g/dL (ref 30.0–36.0)
MCV: 78.2 fl (ref 78.0–100.0)
Monocytes Absolute: 0.4 10*3/uL (ref 0.1–1.0)
Monocytes Relative: 6.1 % (ref 3.0–12.0)
Neutro Abs: 4.3 10*3/uL (ref 1.4–7.7)
Neutrophils Relative %: 73 % (ref 43.0–77.0)
Platelets: 269 10*3/uL (ref 150.0–400.0)
RBC: 5.37 Mil/uL (ref 4.22–5.81)
RDW: 14.9 % (ref 11.5–15.5)
WBC: 5.8 10*3/uL (ref 4.0–10.5)

## 2022-04-28 LAB — PSA: PSA: 1.37 ng/mL (ref 0.10–4.00)

## 2022-04-28 LAB — TSH: TSH: 1.45 u[IU]/mL (ref 0.35–5.50)

## 2022-04-28 NOTE — Progress Notes (Signed)
   Subjective:    Patient ID: Alexander Schroeder, male    DOB: 01-25-1966, 56 y.o.   MRN: [id number]  HPI CPE- UTD on colonoscopy, flu.  Due for Tdap  Patient Care Team    Relationship Specialty Notifications Start End  Sheliah Hatch, MD PCP - General   05/26/10   Hart Carwin, MD (Inactive)  Gastroenterology  02/12/15     Health Maintenance  Topic Date Due   TETANUS/TDAP  01/10/2022   COLONOSCOPY (Pts 45-67yrs Insurance coverage will need to be confirmed)  04/11/2024   INFLUENZA VACCINE  Completed   COVID-19 Vaccine  Completed   Hepatitis C Screening  Completed   HIV Screening  Completed   HPV VACCINES  Aged Out   Zoster Vaccines- Shingrix  Discontinued      Review of Systems Patient reports no vision/hearing changes, anorexia, fever ,adenopathy, persistant/recurrent hoarseness, swallowing issues, chest pain, palpitations, edema, persistant/recurrent cough, hemoptysis, dyspnea (rest,exertional, paroxysmal nocturnal), gastrointestinal  bleeding (melena, rectal bleeding), abdominal pain, excessive heart burn, GU symptoms (dysuria, hematuria, voiding/incontinence issues) syncope, focal weakness, memory loss, numbness & tingling, skin/hair/nail changes, depression, anxiety, abnormal bruising/bleeding, musculoskeletal symptoms/signs.     Objective:   Physical Exam General Appearance:    Alert, cooperative, no distress, appears stated age  Head:    Normocephalic, without obvious abnormality, atraumatic  Eyes:    PERRL, conjunctiva/corneas clear, EOM's intact both eyes       Ears:    Normal TM's and external ear canals, both ears  Nose:   Nares normal, septum midline, mucosa normal, no drainage   or sinus tenderness  Throat:   Lips, mucosa, and tongue normal; teeth and gums normal  Neck:   Supple, symmetrical, trachea midline, no adenopathy;       thyroid:  No enlargement/tenderness/nodules  Back:     Symmetric, no curvature, ROM normal, no CVA tenderness  Lungs:     Clear to  auscultation bilaterally, respirations unlabored  Chest wall:    No tenderness or deformity  Heart:    Regular rate and rhythm, S1 and S2 normal, no murmur, rub   or gallop  Abdomen:     Soft, non-tender, bowel sounds active all four quadrants,    no masses, no organomegaly  Genitalia:    deferred  Rectal:    Extremities:   Extremities normal, atraumatic, no cyanosis or edema  Pulses:   2+ and symmetric all extremities  Skin:   Skin color, texture, turgor normal, no rashes or lesions  Lymph nodes:   Cervical, supraclavicular, and axillary nodes normal  Neurologic:   CNII-XII intact. Normal strength, sensation and reflexes      throughout          Assessment & Plan:

## 2022-04-28 NOTE — Patient Instructions (Addendum)
Follow up in 1 year or as needed We'll notify you of your lab results and make any changes if needed Keep up the good work on healthy diet and regular exercise- you look great! Call with any questions or concerns Stay Safe! Stay Healthy! Happy Holidays!!! 

## 2022-04-28 NOTE — Assessment & Plan Note (Signed)
Pt is exercising regularly.  Check labs to risk stratify.  Will follow.

## 2022-04-28 NOTE — Assessment & Plan Note (Signed)
Pt's PE WNL.  UTD on colonoscopy, flu.  Tdap given today.  Check labs.  Anticipatory guidance provided.

## 2022-04-29 LAB — LIPID PANEL
Cholesterol: 178 mg/dL (ref 0–200)
HDL: 42.9 mg/dL (ref >39.00)
LDL Cholesterol: 118 mg/dL — ABNORMAL HIGH (ref 0–99)
NonHDL: 135.1
Total CHOL/HDL Ratio: 4
Triglycerides: 86 mg/dL (ref 0.0–149.0)
VLDL: 17.2 mg/dL (ref 0.0–40.0)

## 2022-05-02 ENCOUNTER — Telehealth: Payer: Self-pay

## 2022-05-02 NOTE — Telephone Encounter (Signed)
-----   Message from Katherine E Tabori, MD sent at 05/02/2022  7:32 AM EST ----- Labs look great!  No changes at this time 

## 2022-05-02 NOTE — Telephone Encounter (Signed)
Left pt a Vm stating lab results

## 2022-06-16 ENCOUNTER — Other Ambulatory Visit: Payer: Self-pay | Admitting: Family

## 2022-06-20 ENCOUNTER — Other Ambulatory Visit: Payer: Self-pay

## 2022-06-20 ENCOUNTER — Encounter: Payer: Self-pay | Admitting: Family Medicine

## 2022-06-20 MED ORDER — CITALOPRAM HYDROBROMIDE 20 MG PO TABS: 20.0000 mg | ORAL_TABLET | Freq: Every day | ORAL | 1 refills | Status: DC

## 2022-10-28 ENCOUNTER — Other Ambulatory Visit: Payer: Self-pay | Admitting: Family Medicine

## 2022-11-26 DIAGNOSIS — L309 Dermatitis, unspecified: Secondary | ICD-10-CM | POA: Insufficient documentation

## 2022-12-19 ENCOUNTER — Other Ambulatory Visit: Payer: Self-pay | Admitting: Family Medicine

## 2023-01-03 ENCOUNTER — Ambulatory Visit (INDEPENDENT_AMBULATORY_CARE_PROVIDER_SITE_OTHER): Payer: BC Managed Care – PPO | Admitting: Family Medicine

## 2023-01-03 ENCOUNTER — Encounter: Payer: Self-pay | Admitting: Family Medicine

## 2023-01-03 VITALS — BP 128/82 | HR 57 | Temp 98.8°F | Ht 66.0 in | Wt 176.1 lb

## 2023-01-03 DIAGNOSIS — S20469A Insect bite (nonvenomous) of unspecified back wall of thorax, initial encounter: Secondary | ICD-10-CM

## 2023-01-03 DIAGNOSIS — S30861A Insect bite (nonvenomous) of abdominal wall, initial encounter: Secondary | ICD-10-CM | POA: Diagnosis not present

## 2023-01-03 DIAGNOSIS — S40869A Insect bite (nonvenomous) of unspecified upper arm, initial encounter: Secondary | ICD-10-CM | POA: Diagnosis not present

## 2023-01-03 DIAGNOSIS — W57XXXA Bitten or stung by nonvenomous insect and other nonvenomous arthropods, initial encounter: Secondary | ICD-10-CM

## 2023-01-03 DIAGNOSIS — S80869A Insect bite (nonvenomous), unspecified lower leg, initial encounter: Secondary | ICD-10-CM

## 2023-01-03 MED ORDER — PREDNISONE 10 MG PO TABS: ORAL_TABLET | ORAL | 0 refills | Status: AC

## 2023-01-03 NOTE — Progress Notes (Addendum)
Acute Office Visit   Subjective:  Patient ID: Alexander Schroeder, male    DOB: Oct 17, 1965, 57 y.o.   MRN: [id number]  Chief Complaint  Patient presents with   Insect Bite    Pt states he was sting but bee or hornets this morning while jogging Pt has taken OTC Benadryl  SX-burning and swelling. Pt reports he fell as he was being stung and has scrap on RT knee    HPI Patient is a 57 year old male that complains of being stung by either yellow jackets, bees, or wasp this morning while jogging. He reports he got bit multiple times on upper/lower legs, upper/lower arms, back, and abd. Localized swelling, itching, and burning.   He reports he has took Benadryl 2 tablets about 3 hours ago. It has helped with swelling and itching. Some relief with pain. Also, applied calamine lotion to the areas.   Denies any problems or complications from breathing.   ROS See HPI above      Objective:   BP 128/82   Pulse (!) 57   Temp 98.8 F (37.1 C)   Ht 5\' 6"  (1.676 m)   Wt 176 lb 2 oz (79.9 kg)   BMI 28.43 kg/m    Physical Exam Vitals reviewed.  Constitutional:      General: He is not in acute distress.    Appearance: Normal appearance. He is not ill-appearing, toxic-appearing or diaphoretic.  HENT:     Head: Normocephalic and atraumatic.     Mouth/Throat:     Pharynx: Oropharynx is clear. Uvula midline. No pharyngeal swelling, oropharyngeal exudate, posterior oropharyngeal erythema or uvula swelling.  Eyes:     General:        Right eye: No discharge.        Left eye: No discharge.     Conjunctiva/sclera: Conjunctivae normal.  Cardiovascular:     Rate and Rhythm: Normal rate and regular rhythm.     Heart sounds: Normal heart sounds. No murmur heard.    No friction rub. No gallop.  Pulmonary:     Effort: Pulmonary effort is normal. No respiratory distress.     Breath sounds: Normal breath sounds.  Musculoskeletal:        General: Normal range of motion.  Skin:    General: Skin  is warm and dry.     Comments: Multiple single whelps on back, flank, arms, and legs. Red and inflamed.   Neurological:     General: No focal deficit present.     Mental Status: He is alert and oriented to person, place, and time. Mental status is at baseline.  Psychiatric:        Mood and Affect: Mood normal.        Behavior: Behavior normal.        Thought Content: Thought content normal.        Judgment: Judgment normal.       Assessment & Plan:  Insect bite, unspecified site, initial encounter -     predniSONE; Take 6 tablets (60 mg total) by mouth daily with breakfast for 1 day, THEN 5 tablets (50 mg total) daily with breakfast for 1 day, THEN 4 tablets (40 mg total) daily with breakfast for 1 day, THEN 3 tablets (30 mg total) daily with breakfast for 1 day, THEN 2 tablets (20 mg total) daily with breakfast for 1 day, THEN 1 tablet (10 mg total) daily with breakfast for 1 day.  Dispense: 21 tablet; Refill: 0  -  Prescribed Prednisone 10mg , 6 day taper. -Can continue to take Benadryl, mostly at night time, and Calamine lotion. -Recommend to take cool or luke warm showers. This will help with decreasing itching.  -If you develop anaphylaxis symptoms or signs, please go directly to the emergency department. -Follow up if symptoms become worse or do not improve.   Zandra Abts, NP

## 2023-01-03 NOTE — Patient Instructions (Addendum)
-  Prescribed Prednisone 10mg , 6 day taper. -Can continue to take Benadryl, mostly at night time, and Calamine lotion. -Recommend to take cool or luke warm showers. This will help with decreasing itching.  -If you develop anaphylaxis symptoms or signs, please go directly to the emergency department. -Follow up if symptoms become worse or do not improve.

## 2023-02-12 DIAGNOSIS — D229 Melanocytic nevi, unspecified: Secondary | ICD-10-CM | POA: Insufficient documentation

## 2023-02-21 ENCOUNTER — Ambulatory Visit (INDEPENDENT_AMBULATORY_CARE_PROVIDER_SITE_OTHER): Payer: BC Managed Care – PPO

## 2023-02-21 DIAGNOSIS — Z23 Encounter for immunization: Secondary | ICD-10-CM | POA: Diagnosis not present

## 2023-02-21 NOTE — Progress Notes (Signed)
Alexander Schroeder is a 57 y.o. male presents to the office today for Influenza injections, per physician's orders.  Influenza left deltoid (location) today. Patient tolerated injection.  Patient next injection due: 25-26 season, appt made No  [employer]

## 2023-03-16 ENCOUNTER — Other Ambulatory Visit (HOSPITAL_BASED_OUTPATIENT_CLINIC_OR_DEPARTMENT_OTHER): Payer: Self-pay

## 2023-03-16 MED ORDER — COVID-19 MRNA VAC-TRIS(PFIZER) 30 MCG/0.3ML IM SUSY
0.3000 mL | PREFILLED_SYRINGE | Freq: Once | INTRAMUSCULAR | 0 refills | Status: AC
  Filled 2023-03-16: qty 0.3, 1d supply, fill #0

## 2023-05-01 ENCOUNTER — Ambulatory Visit: Payer: BC Managed Care – PPO | Admitting: Family Medicine

## 2023-05-01 ENCOUNTER — Encounter: Payer: Self-pay | Admitting: Family Medicine

## 2023-05-01 VITALS — BP 124/74 | HR 64 | Temp 97.8°F | Ht 65.0 in | Wt 178.0 lb

## 2023-05-01 DIAGNOSIS — Z136 Encounter for screening for cardiovascular disorders: Secondary | ICD-10-CM | POA: Diagnosis not present

## 2023-05-01 DIAGNOSIS — Z0000 Encounter for general adult medical examination without abnormal findings: Secondary | ICD-10-CM | POA: Diagnosis not present

## 2023-05-01 DIAGNOSIS — E663 Overweight: Secondary | ICD-10-CM | POA: Diagnosis not present

## 2023-05-01 DIAGNOSIS — Z125 Encounter for screening for malignant neoplasm of prostate: Secondary | ICD-10-CM

## 2023-05-01 LAB — BASIC METABOLIC PANEL
BUN: 15 mg/dL (ref 6–23)
CO2: 26 meq/L (ref 19–32)
Calcium: 9.4 mg/dL (ref 8.4–10.5)
Chloride: 102 meq/L (ref 96–112)
Creatinine, Ser: 1.2 mg/dL (ref 0.40–1.50)
GFR: 66.05 mL/min (ref >60.00)
Glucose, Bld: 102 mg/dL — ABNORMAL HIGH (ref 70–99)
Potassium: 5 meq/L (ref 3.5–5.1)
Sodium: 137 meq/L (ref 135–145)

## 2023-05-01 LAB — CBC WITH DIFFERENTIAL/PLATELET
Basophils Absolute: 0 10*3/uL (ref 0.0–0.1)
Basophils Relative: 0.7 % (ref 0.0–3.0)
Eosinophils Absolute: 0.1 10*3/uL (ref 0.0–0.7)
Eosinophils Relative: 1.8 % (ref 0.0–5.0)
HCT: 40.2 % (ref 39.0–52.0)
Hemoglobin: 12.6 g/dL — ABNORMAL LOW (ref 13.0–17.0)
Lymphocytes Relative: 19.1 % (ref 12.0–46.0)
Lymphs Abs: 0.9 10*3/uL (ref 0.7–4.0)
MCHC: 31.3 g/dL (ref 30.0–36.0)
MCV: 76.9 fL — ABNORMAL LOW (ref 78.0–100.0)
Monocytes Absolute: 0.4 10*3/uL (ref 0.1–1.0)
Monocytes Relative: 7.8 % (ref 3.0–12.0)
Neutro Abs: 3.3 10*3/uL (ref 1.4–7.7)
Neutrophils Relative %: 70.6 % (ref 43.0–77.0)
Platelets: 258 10*3/uL (ref 150.0–400.0)
RBC: 5.23 Mil/uL (ref 4.22–5.81)
RDW: 16.2 % — ABNORMAL HIGH (ref 11.5–15.5)
WBC: 4.7 10*3/uL (ref 4.0–10.5)

## 2023-05-01 LAB — LIPID PANEL
Cholesterol: 177 mg/dL (ref 0–200)
HDL: 36.5 mg/dL — ABNORMAL LOW (ref >39.00)
LDL Cholesterol: 122 mg/dL — ABNORMAL HIGH (ref 0–99)
NonHDL: 140.87
Total CHOL/HDL Ratio: 5
Triglycerides: 96 mg/dL (ref 0.0–149.0)
VLDL: 19.2 mg/dL (ref 0.0–40.0)

## 2023-05-01 LAB — HEPATIC FUNCTION PANEL
ALT: 20 U/L (ref 0–53)
AST: 19 U/L (ref 0–37)
Albumin: 4.3 g/dL (ref 3.5–5.2)
Alkaline Phosphatase: 118 U/L — ABNORMAL HIGH (ref 39–117)
Bilirubin, Direct: 0.1 mg/dL (ref 0.0–0.3)
Total Bilirubin: 0.7 mg/dL (ref 0.2–1.2)
Total Protein: 6.9 g/dL (ref 6.0–8.3)

## 2023-05-01 LAB — TSH: TSH: 1.79 u[IU]/mL (ref 0.35–5.50)

## 2023-05-01 LAB — PSA: PSA: 2.23 ng/mL (ref 0.10–4.00)

## 2023-05-01 MED ORDER — CLOTRIMAZOLE-BETAMETHASONE 1-0.05 % EX CREA: 1.0000 | TOPICAL_CREAM | Freq: Every day | CUTANEOUS | 0 refills | Status: DC

## 2023-05-01 NOTE — Assessment & Plan Note (Signed)
Pt's PE WNL.  UTD on colonoscopy, immunizations.  Check labs.  Anticipatory guidance provided.  

## 2023-05-01 NOTE — Progress Notes (Signed)
   Subjective:    Patient ID: Alexander Schroeder, male    DOB: 1965-09-14, 57 y.o.   MRN: [id number]  HPI CPE- UTD on colonoscopy, Tdap, flu  Patient Care Team    Relationship Specialty Notifications Start End  Sheliah Hatch, MD PCP - General   05/26/10   Hart Carwin, MD (Inactive)  Gastroenterology  02/12/15     Health Maintenance  Topic Date Due   Colonoscopy  04/11/2024   DTaP/Tdap/Td (3 - Td or Tdap) 04/28/2032   INFLUENZA VACCINE  Completed   COVID-19 Vaccine  Completed   Hepatitis C Screening  Completed   HIV Screening  Completed   Zoster Vaccines- Shingrix  Completed   HPV VACCINES  Aged Out      Review of Systems Patient reports no vision/hearing changes, anorexia, fever ,adenopathy, persistant/recurrent hoarseness, swallowing issues, chest pain, palpitations, edema, persistant/recurrent cough, hemoptysis, dyspnea (rest,exertional, paroxysmal nocturnal), gastrointestinal  bleeding (melena, rectal bleeding), abdominal pain, excessive heart burn, GU symptoms (dysuria, hematuria, voiding/incontinence issues) syncope, focal weakness, memory loss, numbness & tingling, skin/hair/nail changes, depression, anxiety, abnormal bruising/bleeding, musculoskeletal symptoms/signs.     Objective:   Physical Exam General Appearance:    Alert, cooperative, no distress, appears stated age  Head:    Normocephalic, without obvious abnormality, atraumatic  Eyes:    PERRL, conjunctiva/corneas clear, EOM's intact both eyes       Ears:    Normal TM's and external ear canals, both ears  Nose:   Nares normal, septum midline, mucosa normal, no drainage   or sinus tenderness  Throat:   Lips, mucosa, and tongue normal; teeth and gums normal  Neck:   Supple, symmetrical, trachea midline, no adenopathy;       thyroid:  No enlargement/tenderness/nodules  Back:     Symmetric, no curvature, ROM normal, no CVA tenderness  Lungs:     Clear to auscultation bilaterally, respirations unlabored  Chest  wall:    No tenderness or deformity  Heart:    Regular rate and rhythm, S1 and S2 normal, no murmur, rub   or gallop  Abdomen:     Soft, non-tender, bowel sounds active all four quadrants,    no masses, no organomegaly  Genitalia:    deferred  Rectal:    Extremities:   Extremities normal, atraumatic, no cyanosis or edema  Pulses:   2+ and symmetric all extremities  Skin:   Skin color, texture, turgor normal, no rashes or lesions  Lymph nodes:   Cervical, supraclavicular, and axillary nodes normal  Neurologic:   CNII-XII intact. Normal strength, sensation and reflexes      throughout          Assessment & Plan:

## 2023-05-01 NOTE — Patient Instructions (Signed)
Follow up in 1 year or as needed We'll notify you of your lab results and make any changes if needed Keep up the good work on healthy diet and regular exercise- you look great! Call with any questions or concerns Stay Safe! Stay Healthy! Happy Holidays!!! 

## 2023-05-03 ENCOUNTER — Telehealth: Payer: Self-pay

## 2023-05-03 NOTE — Telephone Encounter (Signed)
-----   Message from Neena Rhymes sent at 05/03/2023 11:51 AM EST ----- Labs look good!  HDL (good cholesterol) is down a little and LDL (bad cholesterol) is up a little.  This will improve w/ healthy diet and regular exercise.  No need for medication at this time.  Keep up the good work!

## 2023-06-18 ENCOUNTER — Other Ambulatory Visit: Payer: Self-pay | Admitting: Family Medicine

## 2023-11-20 ENCOUNTER — Ambulatory Visit (INDEPENDENT_AMBULATORY_CARE_PROVIDER_SITE_OTHER): Payer: Self-pay | Admitting: Family Medicine

## 2023-11-20 VITALS — BP 122/70 | HR 68 | Temp 98.0°F | Ht 65.0 in | Wt 173.0 lb

## 2023-11-20 DIAGNOSIS — M25511 Pain in right shoulder: Secondary | ICD-10-CM | POA: Diagnosis not present

## 2023-11-20 DIAGNOSIS — G8929 Other chronic pain: Secondary | ICD-10-CM | POA: Diagnosis not present

## 2023-11-20 DIAGNOSIS — R200 Anesthesia of skin: Secondary | ICD-10-CM | POA: Diagnosis not present

## 2023-11-20 MED ORDER — PREDNISONE 10 MG PO TABS: ORAL_TABLET | ORAL | 0 refills | Status: DC

## 2023-11-20 NOTE — Progress Notes (Signed)
   Subjective:    Patient ID: Alexander Schroeder, male    DOB: [birth year], 58 y.o.   MRN: [id number]  HPI R arm numbness- pt reports he will have tingling that starts in R shoulder and radiates down to wrist.  'feels like your foot's asleep'.  Sxs improve when he holds arm overhead.  Doesn't seem to have sxs when exercising or lifting.  Not able to lie on R side due to discomfort.  Sxs started ~6 months ago.  Sxs are becoming more frequent over time.  No sxs in L arm.  Denies neck pain.     Review of Systems For ROS see HPI     Objective:   Physical Exam Vitals reviewed.  Constitutional:      Appearance: Normal appearance.  HENT:     Head: Normocephalic and atraumatic.  Musculoskeletal:     Cervical back: Tenderness (mild TTP over R upper trap spasm) present.     Comments: + impingement signs on R Full ROM of R shoulder  Skin:    General: Skin is warm and dry.  Neurological:     Mental Status: He is alert.     Deep Tendon Reflexes: Reflexes normal.     Comments: No noted RUE weakness on PE           Assessment & Plan:  R arm numbness- new.  Pt reports he will develop sxs any time that R arm is at his side.  Finds himself walking around the neighborhood w/ his arm overhead.  He is L handed.  Unclear if this is coming from neck or shoulder- refer to Ortho for complete evaluation.  Start Prednisone  taper in the meantime.  Pt expressed understanding and is in agreement w/ plan.   R shoulder pain- new.  Start prednisone  as above.  Refer to Ortho.  Pt expressed understanding and is in agreement w/ plan.

## 2023-11-20 NOTE — Patient Instructions (Signed)
 Follow up as needed or as scheduled START the Prednisone  as directed- 3 pills at the same time x3 days, then 2 pills at the same time x3 days, then 1 pill daily.  Take w/ food  Try a heating pad for the muscle spasm and see if that helps We'll call you to schedule your Orthopedic appt Call with any questions or concerns Stay Safe!  Stay Healthy! Enjoy your summer!!!

## 2023-12-14 ENCOUNTER — Other Ambulatory Visit: Payer: Self-pay | Admitting: Family Medicine

## 2024-03-01 ENCOUNTER — Other Ambulatory Visit (HOSPITAL_BASED_OUTPATIENT_CLINIC_OR_DEPARTMENT_OTHER): Payer: Self-pay

## 2024-03-01 MED ORDER — FLUZONE 0.5 ML IM SUSY
0.5000 mL | PREFILLED_SYRINGE | Freq: Once | INTRAMUSCULAR | 0 refills | Status: AC
  Filled 2024-03-01: qty 0.5, 1d supply, fill #0

## 2024-05-01 ENCOUNTER — Encounter: Payer: BC Managed Care – PPO | Admitting: Family Medicine

## 2024-05-03 ENCOUNTER — Other Ambulatory Visit (HOSPITAL_BASED_OUTPATIENT_CLINIC_OR_DEPARTMENT_OTHER): Payer: Self-pay

## 2024-05-03 MED ORDER — COMIRNATY 30 MCG/0.3ML IM SUSY
0.3000 mL | PREFILLED_SYRINGE | Freq: Once | INTRAMUSCULAR | 0 refills | Status: AC
  Filled 2024-05-03: qty 0.3, 1d supply, fill #0

## 2024-06-12 ENCOUNTER — Other Ambulatory Visit: Payer: Self-pay | Admitting: Family Medicine

## 2024-06-18 ENCOUNTER — Telehealth: Payer: Self-pay

## 2024-06-18 ENCOUNTER — Encounter: Payer: Self-pay | Admitting: Gastroenterology

## 2024-06-18 NOTE — Telephone Encounter (Signed)
 Attempted to reach patient concerning colonoscopy recall; unable to speak with patient;  left message and number to the office for patient to call back and schedule appts;

## 2024-06-27 ENCOUNTER — Other Ambulatory Visit: Payer: Self-pay | Admitting: Medical Genetics

## 2024-06-28 ENCOUNTER — Ambulatory Visit: Admitting: Family Medicine

## 2024-06-28 ENCOUNTER — Encounter: Payer: Self-pay | Admitting: Family Medicine

## 2024-06-28 VITALS — BP 132/84 | HR 63 | Ht 64.17 in | Wt 175.6 lb

## 2024-06-28 DIAGNOSIS — Z23 Encounter for immunization: Secondary | ICD-10-CM

## 2024-06-28 DIAGNOSIS — Z125 Encounter for screening for malignant neoplasm of prostate: Secondary | ICD-10-CM | POA: Diagnosis not present

## 2024-06-28 DIAGNOSIS — E663 Overweight: Secondary | ICD-10-CM | POA: Diagnosis not present

## 2024-06-28 DIAGNOSIS — Z1211 Encounter for screening for malignant neoplasm of colon: Secondary | ICD-10-CM

## 2024-06-28 DIAGNOSIS — Z0000 Encounter for general adult medical examination without abnormal findings: Secondary | ICD-10-CM

## 2024-06-28 LAB — CBC WITH DIFFERENTIAL/PLATELET
Basophils Absolute: 0 K/uL (ref 0.0–0.1)
Basophils Relative: 0.6 % (ref 0.0–3.0)
Eosinophils Absolute: 0.1 K/uL (ref 0.0–0.7)
Eosinophils Relative: 1.5 % (ref 0.0–5.0)
HCT: 42.8 % (ref 39.0–52.0)
Hemoglobin: 13.8 g/dL (ref 13.0–17.0)
Lymphocytes Relative: 18.3 % (ref 12.0–46.0)
Lymphs Abs: 1.1 K/uL (ref 0.7–4.0)
MCHC: 32.2 g/dL (ref 30.0–36.0)
MCV: 74.5 fl — ABNORMAL LOW (ref 78.0–100.0)
Monocytes Absolute: 0.3 K/uL (ref 0.1–1.0)
Monocytes Relative: 6 % (ref 3.0–12.0)
Neutro Abs: 4.3 K/uL (ref 1.4–7.7)
Neutrophils Relative %: 73.6 % (ref 43.0–77.0)
Platelets: 280 K/uL (ref 150.0–400.0)
RBC: 5.75 Mil/uL (ref 4.22–5.81)
RDW: 16.7 % — ABNORMAL HIGH (ref 11.5–15.5)
WBC: 5.9 K/uL (ref 4.0–10.5)

## 2024-06-28 LAB — PSA: PSA: 1.44 ng/mL (ref 0.10–4.00)

## 2024-06-28 LAB — LIPID PANEL
Cholesterol: 182 mg/dL (ref 28–200)
HDL: 39.9 mg/dL
LDL Cholesterol: 122 mg/dL — ABNORMAL HIGH (ref 10–99)
NonHDL: 142.15
Total CHOL/HDL Ratio: 5
Triglycerides: 99 mg/dL (ref 10.0–149.0)
VLDL: 19.8 mg/dL (ref 0.0–40.0)

## 2024-06-28 LAB — HEPATIC FUNCTION PANEL
ALT: 22 U/L (ref 3–53)
AST: 19 U/L (ref 5–37)
Albumin: 4.4 g/dL (ref 3.5–5.2)
Alkaline Phosphatase: 122 U/L — ABNORMAL HIGH (ref 39–117)
Bilirubin, Direct: 0.1 mg/dL (ref 0.1–0.3)
Total Bilirubin: 0.8 mg/dL (ref 0.2–1.2)
Total Protein: 7.4 g/dL (ref 6.0–8.3)

## 2024-06-28 LAB — BASIC METABOLIC PANEL WITH GFR
BUN: 14 mg/dL (ref 6–23)
CO2: 30 meq/L (ref 19–32)
Calcium: 9.4 mg/dL (ref 8.4–10.5)
Chloride: 102 meq/L (ref 96–112)
Creatinine, Ser: 1.17 mg/dL (ref 0.40–1.50)
GFR: 67.54 mL/min
Glucose, Bld: 82 mg/dL (ref 70–99)
Potassium: 4.2 meq/L (ref 3.5–5.1)
Sodium: 140 meq/L (ref 135–145)

## 2024-06-28 LAB — TSH: TSH: 1.57 u[IU]/mL (ref 0.35–5.50)

## 2024-06-28 NOTE — Assessment & Plan Note (Signed)
 Pt's PE WNL w/ exception of BMI.  Colonoscopy scheduled. Prevnar given.  Check labs.  Anticipatory guidance provided.

## 2024-06-28 NOTE — Patient Instructions (Signed)
Follow up in 1 year or as needed We'll notify you of your lab results and make any changes if needed Continue to work on healthy diet and regular exercise- you're doing great!!! Call with any questions or concerns Stay Safe!  Stay Healthy! Happy Early Birthday!!! 

## 2024-06-28 NOTE — Progress Notes (Signed)
" ° °  Subjective:    Patient ID: Alexander Schroeder, male    DOB: [birth year], 59 y.o.   MRN: [id number]  HPI CPE- due for colonoscopy (scheduled).  UTD on Tdap, flu.  Health Maintenance  Topic Date Due   Pneumococcal Vaccine: 50+ Years (1 of 1 - PCV) Never done   Colonoscopy  04/11/2024   DTaP/Tdap/Td (3 - Td or Tdap) 04/28/2032   Influenza Vaccine  Completed   HPV VACCINES (No Doses Required) Completed   COVID-19 Vaccine  Completed   Hepatitis C Screening  Completed   HIV Screening  Completed   Zoster Vaccines- Shingrix   Completed   Hepatitis B Vaccines 19-59 Average Risk  Aged Out   Meningococcal B Vaccine  Aged Out    Patient Care Team    Relationship Specialty Notifications Start End  Mahlon Comer BRAVO, MD PCP - General   05/26/10   Obie Princella HERO, MD (Inactive)  Gastroenterology  02/12/15       Review of Systems Patient reports no vision/hearing changes, anorexia, fever ,adenopathy, persistant/recurrent hoarseness, swallowing issues, chest pain, palpitations, edema, persistant/recurrent cough, hemoptysis, dyspnea (rest,exertional, paroxysmal nocturnal), gastrointestinal  bleeding (melena, rectal bleeding), abdominal pain, excessive heart burn, GU symptoms (dysuria, hematuria, voiding/incontinence issues) syncope, focal weakness, memory loss, numbness & tingling, skin/hair/nail changes, depression, anxiety, abnormal bruising/bleeding, musculoskeletal symptoms/signs.     Objective:   Physical Exam General Appearance:    Alert, cooperative, no distress, appears stated age  Head:    Normocephalic, without obvious abnormality, atraumatic  Eyes:    PERRL, conjunctiva/corneas clear, EOM's intact both eyes       Ears:    Normal TM's and external ear canals, both ears  Nose:   Nares normal, septum midline, mucosa normal, no drainage   or sinus tenderness  Throat:   Lips, mucosa, and tongue normal; teeth and gums normal  Neck:   Supple, symmetrical, trachea midline, no adenopathy;        thyroid :  No enlargement/tenderness/nodules  Back:     Symmetric, no curvature, ROM normal, no CVA tenderness  Lungs:     Clear to auscultation bilaterally, respirations unlabored  Chest wall:    No tenderness or deformity  Heart:    Regular rate and rhythm, S1 and S2 normal, no murmur, rub   or gallop  Abdomen:     Soft, non-tender, bowel sounds active all four quadrants,    no masses, no organomegaly  Genitalia:    deferred  Rectal:    Extremities:   Extremities normal, atraumatic, no cyanosis or edema  Pulses:   2+ and symmetric all extremities  Skin:   Skin color, texture, turgor normal, no rashes or lesions  Lymph nodes:   Cervical, supraclavicular, and axillary nodes normal  Neurologic:   CNII-XII intact. Normal strength, sensation and reflexes      throughout          Assessment & Plan:    "

## 2024-06-28 NOTE — Addendum Note (Signed)
 Addended by: Victor Granados N on: 06/28/2024 01:09 PM   Modules accepted: Orders

## 2024-07-02 ENCOUNTER — Ambulatory Visit: Payer: Self-pay | Admitting: Family Medicine

## 2024-07-11 ENCOUNTER — Ambulatory Visit

## 2024-07-11 ENCOUNTER — Encounter: Payer: Self-pay | Admitting: Gastroenterology

## 2024-07-11 VITALS — Ht 65.0 in | Wt 175.0 lb

## 2024-07-11 DIAGNOSIS — Z1211 Encounter for screening for malignant neoplasm of colon: Secondary | ICD-10-CM

## 2024-07-11 MED ORDER — NA SULFATE-K SULFATE-MG SULF 17.5-3.13-1.6 GM/177ML PO SOLN: 1.0000 | Freq: Once | ORAL | 0 refills | Status: AC

## 2024-07-11 NOTE — Progress Notes (Signed)
 PCP MD at time of PV: Tabori, MD __________________________________________________________________________________________________________________________________________  No egg allergy known to patient  No soy allergy known to patient No issues known to pt with past sedation with any surgeries or procedures Patient denies ever being told they had issues or difficulty with intubation  No FH of Malignant Hyperthermia Pt is not on diet pills Pt is not on  home 02  Pt is not on blood thinners  No A fib or A flutter Have any cardiac testing pending--no  LOA: independent  No Chew or Snuff tobacco __________________________________________________________________________________________________________________________________________  Constipation:  no  Prep: suprep  __________________________________________________________________________________________________________________________________________  PV completed with patient. Prep instructions reviewed and provided during apt. Rx sent to preferred pharmacy.  __________________________________________________________________________________________________________________________________________  Patient's chart reviewed by Norleen Schillings CNRA prior to previsit and patient appropriate for the [facility].  Previsit completed and red dot placed by patient's name on their procedure day (on provider's schedule).

## 2024-07-15 ENCOUNTER — Other Ambulatory Visit

## 2024-07-19 ENCOUNTER — Other Ambulatory Visit (HOSPITAL_COMMUNITY): Admission: RE | Admit: 2024-07-19 | Payer: Self-pay

## 2024-07-25 ENCOUNTER — Encounter: Admitting: Gastroenterology

## 2025-06-30 ENCOUNTER — Encounter: Admitting: Family Medicine
